# Patient Record
Sex: Male | Born: 1970 | Race: White | Hispanic: No | Marital: Married | Smoking: Never smoker
Health system: Southern US, Community
[De-identification: ages and names within clinical notes are randomized; demographics above are authoritative.]

## PROBLEM LIST (undated history)

## (undated) DIAGNOSIS — E785 Hyperlipidemia, unspecified: Secondary | ICD-10-CM

## (undated) DIAGNOSIS — R748 Abnormal levels of other serum enzymes: Secondary | ICD-10-CM

## (undated) DIAGNOSIS — I10 Essential (primary) hypertension: Secondary | ICD-10-CM

## (undated) HISTORY — PX: NO PAST SURGERIES: SHX2092

## (undated) HISTORY — DX: Abnormal levels of other serum enzymes: R74.8

---

## 2001-02-13 ENCOUNTER — Emergency Department (HOSPITAL_COMMUNITY): Admission: EM | Admit: 2001-02-13 | Discharge: 2001-02-13 | Payer: Self-pay | Admitting: *Deleted

## 2007-06-09 ENCOUNTER — Emergency Department (HOSPITAL_COMMUNITY): Admission: EM | Admit: 2007-06-09 | Discharge: 2007-06-09 | Payer: Self-pay | Admitting: Emergency Medicine

## 2011-01-12 ENCOUNTER — Encounter: Payer: Self-pay | Admitting: Family Medicine

## 2011-10-08 LAB — CBC
Platelets: 336
RDW: 12.5

## 2011-10-08 LAB — BASIC METABOLIC PANEL
BUN: 13
Calcium: 9.4
Creatinine, Ser: 0.99
GFR calc non Af Amer: >60
Glucose, Bld: 123 — ABNORMAL HIGH
Sodium: 138

## 2011-10-08 LAB — URINALYSIS, ROUTINE W REFLEX MICROSCOPIC
Ketones, ur: 15 — AB
Leukocytes, UA: NEGATIVE
Nitrite: NEGATIVE
Specific Gravity, Urine: >1.03 — ABNORMAL HIGH
Urobilinogen, UA: 0.2
pH: 6

## 2011-10-08 LAB — DIFFERENTIAL
Basophils Absolute: 0
Lymphocytes Relative: 6 — ABNORMAL LOW
Neutro Abs: 15.4 — ABNORMAL HIGH

## 2011-12-26 ENCOUNTER — Encounter: Payer: Self-pay | Admitting: Emergency Medicine

## 2011-12-26 ENCOUNTER — Emergency Department (HOSPITAL_COMMUNITY)
Admission: EM | Admit: 2011-12-26 | Discharge: 2011-12-26 | Disposition: A | Discharge status: Home / Self Care | Payer: BC Managed Care – PPO | Attending: Emergency Medicine | Admitting: Emergency Medicine

## 2011-12-26 DIAGNOSIS — T783XXA Angioneurotic edema, initial encounter: Secondary | ICD-10-CM | POA: Insufficient documentation

## 2011-12-26 DIAGNOSIS — Z79899 Other long term (current) drug therapy: Secondary | ICD-10-CM | POA: Insufficient documentation

## 2011-12-26 DIAGNOSIS — I10 Essential (primary) hypertension: Secondary | ICD-10-CM | POA: Insufficient documentation

## 2011-12-26 DIAGNOSIS — X58XXXA Exposure to other specified factors, initial encounter: Secondary | ICD-10-CM | POA: Insufficient documentation

## 2011-12-26 DIAGNOSIS — R6889 Other general symptoms and signs: Secondary | ICD-10-CM | POA: Insufficient documentation

## 2011-12-26 DIAGNOSIS — E785 Hyperlipidemia, unspecified: Secondary | ICD-10-CM | POA: Insufficient documentation

## 2011-12-26 DIAGNOSIS — R1310 Dysphagia, unspecified: Secondary | ICD-10-CM | POA: Insufficient documentation

## 2011-12-26 DIAGNOSIS — R220 Localized swelling, mass and lump, head: Secondary | ICD-10-CM | POA: Insufficient documentation

## 2011-12-26 HISTORY — DX: Essential (primary) hypertension: I10

## 2011-12-26 HISTORY — DX: Hyperlipidemia, unspecified: E78.5

## 2011-12-26 LAB — RAPID STREP SCREEN (MED CTR MEBANE ONLY): Streptococcus, Group A Screen (Direct): NEGATIVE

## 2011-12-26 MED ORDER — SODIUM CHLORIDE 0.9 % IV SOLN
INTRAVENOUS | Status: DC
  Administered 2011-12-26: 1000 mL via INTRAVENOUS

## 2011-12-26 MED ORDER — PREDNISONE 20 MG PO TABS: 40.0000 mg | ORAL_TABLET | Freq: Every day | ORAL | Status: DC

## 2011-12-26 MED ORDER — DIPHENHYDRAMINE HCL 50 MG/ML IJ SOLN
25.0000 mg | Freq: Once | INTRAMUSCULAR | Status: AC
  Administered 2011-12-26: 25 mg via INTRAVENOUS
  Filled 2011-12-26: qty 1

## 2011-12-26 MED ORDER — DIPHENHYDRAMINE HCL 25 MG PO TABS: 25.0000 mg | ORAL_TABLET | Freq: Four times a day (QID) | ORAL | Status: DC | PRN

## 2011-12-26 MED ORDER — METHYLPREDNISOLONE SODIUM SUCC 125 MG IJ SOLR
125.0000 mg | Freq: Once | INTRAMUSCULAR | Status: AC
Start: 2011-12-26 — End: 2011-12-26
  Administered 2011-12-26: 125 mg via INTRAVENOUS
  Filled 2011-12-26: qty 2

## 2011-12-26 NOTE — ED Provider Notes (Signed)
History     CSN: [id number]  Arrival date & time 12/26/11  0510   First MD Initiated Contact with Patient 12/26/11 [phone]      Chief Complaint  Patient presents with  . Sore Throat  . Oral Swelling    (Consider location/radiation/quality/duration/timing/severity/associated sxs/prior treatment) Patient is a 41 y.o. male presenting with pharyngitis. The history is provided by the patient.  Sore Throat This is a new problem. Pertinent negatives include no abdominal pain and no shortness of breath.   patient states that he woke up this morning with some pain in his throat and the feeling is choking. States it feels like there is something swollen back of his throat. No vomiting. He states his wife looked back and said that hangs of the back of his throat was swollen. No fevers. No nausea vomiting. Mix up some blood before he got here. No change in his medication. No previous symptoms like this.  Past Medical History  Diagnosis Date  . Hypertension   . Hyperlipemia     History reviewed. No pertinent past surgical history.  History reviewed. No pertinent family history.  History  Substance Use Topics  . Smoking status: Never Smoker   . Smokeless tobacco: Not on file  . Alcohol Use: Yes     Occasionally      Review of Systems  Constitutional: Negative for chills.  HENT: Positive for sore throat and trouble swallowing. Negative for voice change and ear discharge.   Respiratory: Positive for choking. Negative for shortness of breath and stridor.   Gastrointestinal: Negative for vomiting and abdominal pain.  Neurological: Negative for weakness and numbness.  Hematological: Negative for adenopathy.    Allergies  Review of patient's allergies indicates no known allergies.  Home Medications   Current Outpatient Rx  Name Route Sig Dispense Refill  . AMLODIPINE BESYLATE 10 MG PO TABS Oral Take 10 mg by mouth daily.      Marland Kitchen LISINOPRIL 10 MG PO TABS Oral Take 10 mg by mouth  daily.      Marland Kitchen SIMVASTATIN 20 MG PO TABS Oral Take 20 mg by mouth every evening.        BP 137/96  Pulse 84  Temp(Src) 97.9 F (36.6 C) (Oral)  Resp 18  Ht 5\' 10"  (1.778 m)  Wt 205 lb (92.987 kg)  BMI 29.41 kg/m2  SpO2 100%  Physical Exam  Constitutional: He appears well-developed and well-nourished.  HENT:  Head: Normocephalic.       Posterior pharyngeal edema uvula and over tonsils and posterior pharynx. No stridor. No exudate.  Eyes: Pupils are equal, round, and reactive to light.  Neck: Normal range of motion. No thyromegaly present.  Cardiovascular: Normal rate and regular rhythm.   Pulmonary/Chest: Effort normal.  Neurological: He is alert.  Skin: Skin is warm and dry. No erythema.    ED Course  Procedures (including critical care time)   Labs Reviewed  RAPID STREP SCREEN   No results found.   No diagnosis found.    MDM  Posterior pharyngeal angioedema. Woke with this morning. No impending airway obstruction this time. He'll be monitored for at least another hour. Steroids and Benadryl given here. His lisinopril will be stopped. He'll follow with his primary care Dr. Care is turned over to Dr. Rae Mar R. Rubin Payor, MD 12/26/11 [phone]

## 2011-12-26 NOTE — ED Notes (Signed)
Patient stated he woke up about 1 hour ago feeling like he is choking. States "it feels like something is swollen in the back of my throat and it's hard to swallow." Patient gagging in triage. Patient states he also spit up some blood before arriving to ED.

## 2012-01-23 ENCOUNTER — Encounter (HOSPITAL_COMMUNITY): Payer: Self-pay | Admitting: Emergency Medicine

## 2012-01-23 ENCOUNTER — Emergency Department (HOSPITAL_COMMUNITY)
Admission: EM | Admit: 2012-01-23 | Discharge: 2012-01-23 | Disposition: A | Discharge status: Home / Self Care | Payer: BC Managed Care – PPO | Attending: Emergency Medicine | Admitting: Emergency Medicine

## 2012-01-23 DIAGNOSIS — K117 Disturbances of salivary secretion: Secondary | ICD-10-CM | POA: Insufficient documentation

## 2012-01-23 DIAGNOSIS — I10 Essential (primary) hypertension: Secondary | ICD-10-CM | POA: Insufficient documentation

## 2012-01-23 DIAGNOSIS — R220 Localized swelling, mass and lump, head: Secondary | ICD-10-CM | POA: Insufficient documentation

## 2012-01-23 DIAGNOSIS — E785 Hyperlipidemia, unspecified: Secondary | ICD-10-CM | POA: Insufficient documentation

## 2012-01-23 DIAGNOSIS — R682 Dry mouth, unspecified: Secondary | ICD-10-CM

## 2012-01-23 DIAGNOSIS — R221 Localized swelling, mass and lump, neck: Secondary | ICD-10-CM | POA: Insufficient documentation

## 2012-01-23 MED ORDER — PREDNISONE 20 MG PO TABS
60.0000 mg | ORAL_TABLET | Freq: Once | ORAL | Status: AC
  Administered 2012-01-23: 60 mg via ORAL
  Filled 2012-01-23: qty 3

## 2012-01-23 MED ORDER — DIPHENHYDRAMINE HCL 25 MG PO CAPS
25.0000 mg | ORAL_CAPSULE | Freq: Once | ORAL | Status: AC
  Administered 2012-01-23: 25 mg via ORAL
  Filled 2012-01-23: qty 1

## 2012-01-23 NOTE — ED Provider Notes (Signed)
History    This chart was scribed for Kevin Roots, MD, MD by Smitty Pluck. The patient was seen in room APA07 and the patient's care was started at 10:13AM.   CSN: [id number]  Arrival date & time 01/23/12  4098   First MD Initiated Contact with Patient 01/23/12 1011      Chief Complaint  Patient presents with  . Oral Swelling    (Consider location/radiation/quality/duration/timing/severity/associated sxs/prior treatment) The history is provided by the patient.   Kevin Russo is a 41 y.o. male who presents to the Emergency Department complaining of throat swelling onset today. Constant. Not painful. No exacerbating or alleviating factors. Has taken no meds, incl benadryl, since onset symptoms.  Pt reports this has happened earlier this month and he was treated with steroids in ED, hx w angioedema. Was on lisinopril then. This feels much milder.. Pt states that the throat and uvula does not feel sore but more sore swollen. Pt denies fever, chills, nausea, rash, hives and vomiting. Pt reports he is able to drink without complication. He states he does not take lisinopril due to previous allergic reaction. Pt currently takes norvasc for HTN. No new meds or change in diet/foods. No family hx lip, tongue, throat swelling, or angioedema.   Past Medical History  Diagnosis Date  . Hypertension   . Hyperlipemia     History reviewed. No pertinent past surgical history.  History reviewed. No pertinent family history.  History  Substance Use Topics  . Smoking status: Never Smoker   . Smokeless tobacco: Not on file  . Alcohol Use: Yes     Occasionally      Review of Systems  All other systems reviewed and are negative.  no trouble breathing or swallowing. No throat pain. No rash/itching.  10 Systems reviewed and are negative for acute change except as noted in the HPI.  Allergies  Lisinopril  Home Medications   Current Outpatient Rx  Name Route Sig Dispense Refill  .  AMLODIPINE BESYLATE 10 MG PO TABS Oral Take 10 mg by mouth at bedtime.     Marland Kitchen DIPHENHYDRAMINE HCL 25 MG PO TABS Oral Take 1 tablet (25 mg total) by mouth every 6 (six) hours as needed for allergies. 20 tablet 0  . PREDNISONE 20 MG PO TABS Oral Take 2 tablets (40 mg total) by mouth daily. 6 tablet 0  . SIMVASTATIN 20 MG PO TABS Oral Take 20 mg by mouth every morning.       BP 145/83  Pulse 88  Temp(Src) 97.6 F (36.4 C) (Oral)  Resp 22  Ht 5\' 10"  (1.778 m)  Wt 205 lb (92.987 kg)  BMI 29.41 kg/m2  SpO2 99%  Physical Exam  Nursing note and vitals reviewed. Constitutional: He is oriented to person, place, and time. He appears well-developed and well-nourished. No distress.  HENT:  Head: Normocephalic and atraumatic.  Mouth/Throat: Oropharynx is clear and moist.       ? minimum edema of uvula  No asymmetric pharyngeal swelling. No exudate. No tongue or lip swelling.   Eyes: EOM are normal. Pupils are equal, round, and reactive to light.  Neck: Normal range of motion. Neck supple. No tracheal deviation present.  Cardiovascular: Normal rate, regular rhythm and normal heart sounds.   Pulmonary/Chest: Effort normal and breath sounds normal. No respiratory distress.       No stridor  Abdominal: Soft. He exhibits no distension.  Musculoskeletal: Normal range of motion.  Neurological: He is alert  and oriented to person, place, and time.  Skin: Skin is warm and dry. No rash noted.  Psychiatric: He has a normal mood and affect. His behavior is normal.    ED Course  Procedures (including critical care time) DIAGNOSTIC STUDIES: Oxygen Saturation is 99% on room air, normal by my interpretation.    COORDINATION OF CARE: 10:20AM EDP ordered medication: deltasone and benadryl    MDM  ?minimal edema to uvula. Benadryl and pred po.  No new med use, no change in diet/foods, no recent lisinopril use.   Recheck no edema noted. Pt also notes in past couple months recurrent problems with mouth  being/feeling dry. States no hx same. Other than norvasc denies other meds, no new meds.  Discussed ent referral/follow up.   I personally performed the services described in this documentation, which was scribed in my presence. The recorded information has been reviewed and considered. Kevin Roots, MD       Kevin Roots, MD 01/23/12 [phone]

## 2012-01-23 NOTE — ED Notes (Signed)
Pt states he woke up choking this am due to throat swelling. Pt states he was seen for the same this month and diagnosed with an allergic reaction. Pt states he stopped taking his lisinopril.

## 2013-09-12 ENCOUNTER — Encounter: Payer: Self-pay | Admitting: Family Medicine

## 2013-09-12 ENCOUNTER — Telehealth: Payer: Self-pay | Admitting: Family Medicine

## 2013-09-12 ENCOUNTER — Ambulatory Visit (INDEPENDENT_AMBULATORY_CARE_PROVIDER_SITE_OTHER): Payer: Managed Care, Other (non HMO) | Admitting: Family Medicine

## 2013-09-12 VITALS — BP 119/79 | HR 86 | Temp 98.2°F | Ht 70.0 in | Wt 212.0 lb

## 2013-09-12 DIAGNOSIS — J029 Acute pharyngitis, unspecified: Secondary | ICD-10-CM

## 2013-09-12 LAB — POCT RAPID STREP A (OFFICE): Rapid Strep A Screen: NEGATIVE

## 2013-09-12 MED ORDER — AZITHROMYCIN 250 MG PO TABS: ORAL_TABLET | ORAL | Status: DC

## 2013-09-12 NOTE — Progress Notes (Signed)
  Subjective:    Patient ID: Kevin Russo, male    DOB: [birth year], 42 y.o.   MRN: [id number]  HPI  This 42 y.o. male presents for evaluation of sinus congestion, sore throat, hurts when he eats Or swallows, washed out and tired, and is having wheeze and cough.  He has no productive Cough.  Review of Systems No chest pain, SOB, HA, dizziness, vision change, N/V, diarrhea, constipation, dysuria, urinary urgency or frequency, myalgias, arthralgias or rash.     Objective:   Physical Exam  Vital signs noted  Well developed well nourished male.  HEENT - Head atraumatic Normocephalic                Eyes - PERRLA, Conjuctiva - clear Sclera- Clear EOMI                Ears - EAC's Wnl TM's Wnl Gross Hearing WNL                Nose - Nares patent                 Throat - oropharanx injected no exudates. Respiratory - Lungs CTA bilateral Cardiac - RRR S1 and S2 without murmur.      Assessment & Plan:  Sore throat - Plan: POCT rapid strep A, azithromycin (ZITHROMAX) 250 MG tablet 2po first day and then one po qd x 4days.  WSWG's, tylenol and motrin otc as directed for pain and fever, push po fluids otc decongestants and mucolytics.

## 2013-09-12 NOTE — Telephone Encounter (Signed)
Appt made for today

## 2013-09-12 NOTE — Patient Instructions (Addendum)
Viral Pharyngitis Viral pharyngitis is a viral infection that produces redness, pain, and swelling (inflammation) of the throat. It can spread from person to person (contagious). CAUSES Viral pharyngitis is caused by inhaling a large amount of certain germs called viruses. Many different viruses cause viral pharyngitis. SYMPTOMS Symptoms of viral pharyngitis include:  Sore throat.  Tiredness.  Stuffy nose.  Low-grade fever.  Congestion.  Cough. TREATMENT Treatment includes rest, drinking plenty of fluids, and the use of over-the-counter medication (approved by your caregiver). HOME CARE INSTRUCTIONS   Drink enough fluids to keep your urine clear or pale yellow.  Eat soft, cold foods such as ice cream, frozen ice pops, or gelatin dessert.  Gargle with warm salt water (1 tsp salt per 1 qt of water).  If over age 7, throat lozenges may be used safely.  Only take over-the-counter or prescription medicines for pain, discomfort, or fever as directed by your caregiver. Do not take aspirin. To help prevent spreading viral pharyngitis to others, avoid:  Mouth-to-mouth contact with others.  Sharing utensils for eating and drinking.  Coughing around others. SEEK MEDICAL CARE IF:   You are better in a few days, then become worse.  You have a fever or pain not helped by pain medicines.  There are any other changes that concern you. Document Released: 09/17/2005 Document Revised: 03/01/2012 Document Reviewed: 02/13/2011 ExitCare Patient Information 2014 [employer].  

## 2013-10-19 ENCOUNTER — Other Ambulatory Visit: Payer: Self-pay | Admitting: *Deleted

## 2013-10-19 MED ORDER — AMLODIPINE BESYLATE 10 MG PO TABS: 10.0000 mg | ORAL_TABLET | Freq: Every day | ORAL | Status: DC

## 2013-12-28 ENCOUNTER — Encounter: Payer: Self-pay | Admitting: Nurse Practitioner

## 2013-12-28 ENCOUNTER — Ambulatory Visit (INDEPENDENT_AMBULATORY_CARE_PROVIDER_SITE_OTHER): Payer: Managed Care, Other (non HMO)

## 2013-12-28 ENCOUNTER — Ambulatory Visit (INDEPENDENT_AMBULATORY_CARE_PROVIDER_SITE_OTHER): Payer: Managed Care, Other (non HMO) | Admitting: Nurse Practitioner

## 2013-12-28 VITALS — BP 138/98 | HR 76 | Temp 99.6°F | Ht 70.0 in | Wt 218.0 lb

## 2013-12-28 DIAGNOSIS — R05 Cough: Secondary | ICD-10-CM

## 2013-12-28 DIAGNOSIS — I10 Essential (primary) hypertension: Secondary | ICD-10-CM

## 2013-12-28 DIAGNOSIS — J069 Acute upper respiratory infection, unspecified: Secondary | ICD-10-CM

## 2013-12-28 DIAGNOSIS — R059 Cough, unspecified: Secondary | ICD-10-CM

## 2013-12-28 MED ORDER — AZITHROMYCIN 250 MG PO TABS: ORAL_TABLET | ORAL | Status: DC

## 2013-12-28 MED ORDER — HYDROCODONE-HOMATROPINE 5-1.5 MG/5ML PO SYRP: 5.0000 mL | ORAL_SOLUTION | Freq: Three times a day (TID) | ORAL | Status: DC | PRN

## 2013-12-28 NOTE — Progress Notes (Signed)
   Subjective:    Patient ID: Kevin Russo, male    DOB: 11/19/1971, 43 y.o.   MRN: [id number]  HPI Patient in c/o cough and congestion- has had intermittent cough since first of December-    Review of Systems  Constitutional: Positive for fever (low grade). Negative for chills and appetite change.  HENT: Positive for congestion, rhinorrhea, sinus pressure, sneezing and sore throat. Negative for ear pain and trouble swallowing.   Respiratory: Positive for cough.   Cardiovascular: Negative.   Gastrointestinal: Negative.   All other systems reviewed and are negative.       Objective:   Physical Exam  Constitutional: He is oriented to person, place, and time. He appears well-developed and well-nourished.  HENT:  Right Ear: Hearing, tympanic membrane, external ear and ear canal normal.  Left Ear: Hearing, tympanic membrane, external ear and ear canal normal.  Nose: Mucosal edema and rhinorrhea present. Right sinus exhibits no maxillary sinus tenderness and no frontal sinus tenderness. Left sinus exhibits no maxillary sinus tenderness and no frontal sinus tenderness.  Mouth/Throat: Uvula is midline, oropharynx is clear and moist and mucous membranes are normal.  Neck: Normal range of motion. Neck supple.  Cardiovascular: Normal rate, regular rhythm and normal heart sounds.   Pulmonary/Chest: Effort normal and breath sounds normal.  Wet cough  Abdominal: Soft. Bowel sounds are normal.  Lymphadenopathy:    He has no cervical adenopathy.  Neurological: He is alert and oriented to person, place, and time.  Skin: Skin is warm and dry.  Psychiatric: He has a normal mood and affect. His behavior is normal. Judgment and thought content normal.    BP 138/98  Pulse 76  Temp(Src) 99.6 F (37.6 C) (Oral)  Ht 5\' 10"  (1.778 m)  Wt 218 lb (98.884 kg)  BMI 31.28 kg/m2       Assessment & Plan:   1. Cough   2. Hypertension   3. Upper respiratory infection    Meds ordered this  encounter  Medications  . azithromycin (ZITHROMAX Z-PAK) 250 MG tablet    Sig: As directed    Dispense:  6 each    Refill:  0    Order Specific Question:  Supervising Provider    Answer:  Ernestina PennaMOORE, DONALD W [[id number]]  . HYDROcodone-homatropine (HYCODAN) 5-1.5 MG/5ML syrup    Sig: Take 5 mLs by mouth every 8 (eight) hours as needed for cough.    Dispense:  120 mL    Refill:  0    Order Specific Question:  Supervising Provider    Answer:  Ernestina PennaMOORE, DONALD W [[id number]]   1. Take meds as prescribed 2. Use a cool mist humidifier especially during the winter months and when heat has  been humid. 3. Use saline nose sprays frequently 4. Saline irrigations of the nose can be very helpful if done frequently.  * 4X daily for 1 week*  * Use of a nettie pot can be helpful with this. Follow directions with this* 5. Drink plenty of fluids 6. Keep thermostat turn down low 7.For any cough or congestion  Use plain Mucinex- regular strength or max strength is fine   * Children- consult with Pharmacist for dosing 8. For fever or aces or pains- take tylenol or ibuprofen appropriate for age and weight.  * for fevers greater than 101 orally you may alternate ibuprofen and tylenol every  3 hours.   Mary-Margaret Daphine DeutscherMartin, FNP

## 2013-12-28 NOTE — Patient Instructions (Signed)

## 2014-06-09 ENCOUNTER — Other Ambulatory Visit: Payer: Self-pay | Admitting: Family Medicine

## 2014-07-09 ENCOUNTER — Other Ambulatory Visit: Payer: Self-pay | Admitting: Family Medicine

## 2014-08-21 ENCOUNTER — Other Ambulatory Visit: Payer: Self-pay | Admitting: Family Medicine

## 2014-09-22 ENCOUNTER — Other Ambulatory Visit: Payer: Self-pay | Admitting: Family Medicine

## 2014-09-25 NOTE — Telephone Encounter (Signed)
Last office visit was 12-28-13. Please advise on refill

## 2014-09-25 NOTE — Telephone Encounter (Signed)
no more refills without being seen  

## 2014-10-31 ENCOUNTER — Other Ambulatory Visit: Payer: Self-pay | Admitting: Nurse Practitioner

## 2014-10-31 NOTE — Telephone Encounter (Signed)
no more refills without being seen  

## 2014-10-31 NOTE — Telephone Encounter (Signed)
Last ov 1/15. ntbs 

## 2014-11-30 ENCOUNTER — Other Ambulatory Visit: Payer: Self-pay | Admitting: Nurse Practitioner

## 2014-12-11 ENCOUNTER — Other Ambulatory Visit: Payer: Self-pay | Admitting: Nurse Practitioner

## 2015-01-21 ENCOUNTER — Other Ambulatory Visit: Payer: Self-pay | Admitting: Nurse Practitioner

## 2015-03-22 ENCOUNTER — Ambulatory Visit (INDEPENDENT_AMBULATORY_CARE_PROVIDER_SITE_OTHER): Payer: Managed Care, Other (non HMO) | Admitting: Nurse Practitioner

## 2015-03-22 ENCOUNTER — Encounter: Payer: Self-pay | Admitting: Nurse Practitioner

## 2015-03-22 VITALS — BP 144/95 | HR 88 | Temp 98.7°F | Wt 226.0 lb

## 2015-03-22 DIAGNOSIS — K2100 Gastro-esophageal reflux disease with esophagitis, without bleeding: Secondary | ICD-10-CM | POA: Insufficient documentation

## 2015-03-22 MED ORDER — PANTOPRAZOLE SODIUM 40 MG PO TBEC: 40.0000 mg | DELAYED_RELEASE_TABLET | Freq: Every day | ORAL | Status: DC

## 2015-03-22 NOTE — Progress Notes (Signed)
  Subjective:     Kevin Russo is a 44 y.o. male who presents for evaluation of sinus pain. Symptoms include: congestion, cough, headaches and nasal congestion. Onset of symptoms was 2 months ago. Symptoms have been stable since that time. Cough gets worse after eating or when laying down. Has occasionaly heart burn. Eats a lot of [place] on his food Past history is significant for no history of pneumonia or bronchitis. Patient is a non-smoker. Has been to the ER 2x and has not gotten any better. Was given prednisone and cough syrup and has had a z pak.  The following portions of the patient's history were reviewed and updated as appropriate: allergies, current medications, past family history, past medical history, past social history, past surgical history and problem list.  Review of Systems Pertinent items are noted in HPI.   Objective:    General appearance: alert and cooperative Eyes: conjunctivae/corneas clear. PERRL, EOM's intact. Fundi benign. Ears: normal TM's and external ear canals both ears Nose: Nares normal. Septum midline. Mucosa normal. No drainage or sinus tenderness. Throat: lips, mucosa, and tongue normal; teeth and gums normal Neck: no adenopathy, no carotid bruit, no JVD, supple, symmetrical, trachea midline and thyroid not enlarged, symmetric, no tenderness/mass/nodules Lungs: clear to auscultation bilaterally Heart: regular rate and rhythm, S1, S2 normal, no murmur, click, rub or gallop    Assessment:    GERD.    Plan:   Meds ordered this encounter  Medications  . pantoprazole (PROTONIX) 40 MG tablet    Sig: Take 1 tablet (40 mg total) by mouth daily.    Dispense:  30 tablet    Refill:  3    Order Specific Question:  Supervising Provider    Answer:  Ernestina PennaMOORE, DONALD W [[id number]]   Avoid spicy and fatty foods- cut back on use of [place]. Do not eat 2 hours prior to bedtime RTO prn  Mary-Margaret Daphine DeutscherMartin, FNP

## 2015-03-22 NOTE — Patient Instructions (Signed)
Food Choices for Gastroesophageal Reflux Disease When you have gastroesophageal reflux disease (GERD), the foods you eat and your eating habits are very important. Choosing the right foods can help ease the discomfort of GERD. WHAT GENERAL GUIDELINES DO I NEED TO FOLLOW?  Choose fruits, vegetables, whole grains, low-fat dairy products, and low-fat meat, fish, and poultry.  Limit fats such as oils, salad dressings, butter, nuts, and avocado.  Keep a food diary to identify foods that cause symptoms.  Avoid foods that cause reflux. These may be different for different people.  Eat frequent small meals instead of three large meals each day.  Eat your meals slowly, in a relaxed setting.  Limit fried foods.  Cook foods using methods other than frying.  Avoid drinking alcohol.  Avoid drinking large amounts of liquids with your meals.  Avoid bending over or lying down until 2-3 hours after eating. WHAT FOODS ARE NOT RECOMMENDED? The following are some foods and drinks that may worsen your symptoms: Vegetables Tomatoes. Tomato juice. Tomato and spaghetti sauce. Chili peppers. Onion and garlic. Horseradish. Fruits Oranges, grapefruit, and lemon (fruit and juice). Meats High-fat meats, fish, and poultry. This includes hot dogs, ribs, ham, sausage, salami, and bacon. Dairy Whole milk and chocolate milk. Sour cream. Cream. Butter. Ice cream. Cream cheese.  Beverages Coffee and tea, with or without caffeine. Carbonated beverages or energy drinks. Condiments Hot sauce. Barbecue sauce.  Sweets/Desserts Chocolate and cocoa. Donuts. Peppermint and spearmint. Fats and Oils High-fat foods, including French fries and potato chips. Other Vinegar. Strong spices, such as black pepper, white pepper, red pepper, cayenne, curry powder, cloves, ginger, and chili powder. The items listed above may not be a complete list of foods and beverages to avoid. Contact your dietitian for more  information. Document Released: 12/08/2005 Document Revised: 12/13/2013 Document Reviewed: 10/12/2013 ExitCare Patient Information 2015 [employer]. This information is not intended to replace advice given to you by your health care provider. Make sure you discuss any questions you have with your health care provider. Gastroesophageal Reflux Disease, Adult Gastroesophageal reflux disease (GERD) happens when acid from your stomach flows up into the esophagus. When acid comes in contact with the esophagus, the acid causes soreness (inflammation) in the esophagus. Over time, GERD may create small holes (ulcers) in the lining of the esophagus. CAUSES   Increased body weight. This puts pressure on the stomach, making acid rise from the stomach into the esophagus.  Smoking. This increases acid production in the stomach.  Drinking alcohol. This causes decreased pressure in the lower esophageal sphincter (valve or ring of muscle between the esophagus and stomach), allowing acid from the stomach into the esophagus.  Late evening meals and a full stomach. This increases pressure and acid production in the stomach.  A malformed lower esophageal sphincter. Sometimes, no cause is found. SYMPTOMS   Burning pain in the lower part of the mid-chest behind the breastbone and in the mid-stomach area. This may occur twice a week or more often.  Trouble swallowing.  Sore throat.  Dry cough.  Asthma-like symptoms including chest tightness, shortness of breath, or wheezing. DIAGNOSIS  Your caregiver may be able to diagnose GERD based on your symptoms. In some cases, X-rays and other tests may be done to check for complications or to check the condition of your stomach and esophagus. TREATMENT  Your caregiver may recommend over-the-counter or prescription medicines to help decrease acid production. Ask your caregiver before starting or adding any new medicines.  HOME CARE   INSTRUCTIONS   Change the  factors that you can control. Ask your caregiver for guidance concerning weight loss, quitting smoking, and alcohol consumption.  Avoid foods and drinks that make your symptoms worse, such as:  Caffeine or alcoholic drinks.  Chocolate.  Peppermint or mint flavorings.  Garlic and onions.  Spicy foods.  Citrus fruits, such as oranges, lemons, or limes.  Tomato-based foods such as sauce, chili, salsa, and pizza.  Fried and fatty foods.  Avoid lying down for the 3 hours prior to your bedtime or prior to taking a nap.  Eat small, frequent meals instead of large meals.  Wear loose-fitting clothing. Do not wear anything tight around your waist that causes pressure on your stomach.  Raise the head of your bed 6 to 8 inches with wood blocks to help you sleep. Extra pillows will not help.  Only take over-the-counter or prescription medicines for pain, discomfort, or fever as directed by your caregiver.  Do not take aspirin, ibuprofen, or other nonsteroidal anti-inflammatory drugs (NSAIDs). SEEK IMMEDIATE MEDICAL CARE IF:   You have pain in your arms, neck, jaw, teeth, or back.  Your pain increases or changes in intensity or duration.  You develop nausea, vomiting, or sweating (diaphoresis).  You develop shortness of breath, or you faint.  Your vomit is green, yellow, black, or looks like coffee grounds or blood.  Your stool is red, bloody, or black. These symptoms could be signs of other problems, such as heart disease, gastric bleeding, or esophageal bleeding. MAKE SURE YOU:   Understand these instructions.  Will watch your condition.  Will get help right away if you are not doing well or get worse. Document Released: 09/17/2005 Document Revised: 03/01/2012 Document Reviewed: 06/27/2011 ExitCare Patient Information 2015 [employer]. This information is not intended to replace advice given to you by your health care provider. Make sure you discuss any questions you have  with your health care provider.  

## 2015-08-02 ENCOUNTER — Other Ambulatory Visit: Payer: Self-pay | Admitting: Nurse Practitioner

## 2015-08-02 NOTE — Telephone Encounter (Signed)
No labs here ever,

## 2015-08-02 NOTE — Telephone Encounter (Signed)
Patient aware rx sent to pharmacy and needs an appointment

## 2015-08-02 NOTE — Telephone Encounter (Signed)
Only 30 day supply of norvac approved Patient NTBS for follow up and lab work

## 2015-09-02 ENCOUNTER — Other Ambulatory Visit: Payer: Self-pay | Admitting: Nurse Practitioner

## 2015-09-21 ENCOUNTER — Other Ambulatory Visit: Payer: Self-pay | Admitting: Nurse Practitioner

## 2015-10-11 ENCOUNTER — Encounter: Payer: Managed Care, Other (non HMO) | Admitting: Family Medicine

## 2015-10-12 ENCOUNTER — Encounter: Payer: Self-pay | Admitting: Nurse Practitioner

## 2015-10-26 ENCOUNTER — Ambulatory Visit (INDEPENDENT_AMBULATORY_CARE_PROVIDER_SITE_OTHER): Payer: Managed Care, Other (non HMO) | Admitting: Family

## 2015-10-26 ENCOUNTER — Encounter: Payer: Self-pay | Admitting: Family

## 2015-10-26 VITALS — BP 161/103 | HR 115 | Temp 98.3°F | Ht 70.5 in | Wt 228.6 lb

## 2015-10-26 DIAGNOSIS — Z0000 Encounter for general adult medical examination without abnormal findings: Secondary | ICD-10-CM | POA: Diagnosis not present

## 2015-10-26 DIAGNOSIS — I10 Essential (primary) hypertension: Secondary | ICD-10-CM

## 2015-10-26 DIAGNOSIS — K2100 Gastro-esophageal reflux disease with esophagitis, without bleeding: Secondary | ICD-10-CM

## 2015-10-26 DIAGNOSIS — Z23 Encounter for immunization: Secondary | ICD-10-CM

## 2015-10-26 MED ORDER — LISINOPRIL-HYDROCHLOROTHIAZIDE 20-12.5 MG PO TABS: 1.0000 | ORAL_TABLET | Freq: Every day | ORAL | Status: DC

## 2015-10-26 MED ORDER — AMLODIPINE BESYLATE 10 MG PO TABS: 10.0000 mg | ORAL_TABLET | Freq: Every day | ORAL | Status: DC

## 2015-10-26 NOTE — Patient Instructions (Signed)

## 2015-10-26 NOTE — Addendum Note (Signed)
Addended by: Almeta MonasSTONE, Chaselynn Kepple M on: 10/26/2015 04:23 PM   Modules accepted: Orders

## 2015-10-26 NOTE — Progress Notes (Signed)
Subjective:    Patient ID: Kevin Russo, male    DOB: [birth year], 44 y.o.   MRN: [id number]  PT presents to the office today for CPE. Pt's BP is elevated today. Pt states he is very "stressed" today.  Hypertension This is a chronic problem. The current episode started more than 1 year ago. The problem has been waxing and waning since onset. The problem is uncontrolled. Pertinent negatives include no headaches, palpitations, peripheral edema or shortness of breath. Risk factors for coronary artery disease include family history, male gender, obesity and sedentary lifestyle. Past treatments include calcium channel blockers. The current treatment provides mild improvement. There is no history of kidney disease, CAD/MI, CVA, heart failure or a thyroid problem. There is no history of sleep apnea.  Gastroesophageal Reflux He reports no belching, no choking or no heartburn. This is a chronic problem. The current episode started more than 1 year ago. The problem occurs rarely. The problem has been waxing and waning. Risk factors include obesity. He has tried a PPI for the symptoms. The treatment provided moderate relief.      Review of Systems  Constitutional: Negative.   HENT: Negative.   Respiratory: Negative.  Negative for choking and shortness of breath.   Cardiovascular: Negative.  Negative for palpitations.  Gastrointestinal: Negative.  Negative for heartburn.  Endocrine: Negative.   Genitourinary: Negative.   Musculoskeletal: Negative.   Neurological: Negative.  Negative for headaches.  Hematological: Negative.   Psychiatric/Behavioral: Negative.   All other systems reviewed and are negative.      Objective:   Physical Exam  Constitutional: He is oriented to person, place, and time. He appears well-developed and well-nourished. No distress.  HENT:  Head: Normocephalic.  Right Ear: External ear normal.  Left Ear: External ear normal.  Nose: Nose normal.  Mouth/Throat: Oropharynx  is clear and moist.  Eyes: Pupils are equal, round, and reactive to light. Right eye exhibits no discharge. Left eye exhibits no discharge.  Neck: Normal range of motion. Neck supple. No thyromegaly present.  Cardiovascular: Normal rate, regular rhythm, normal heart sounds and intact distal pulses.   No murmur heard. Pulmonary/Chest: Effort normal and breath sounds normal. No respiratory distress. He has no wheezes.  Abdominal: Soft. Bowel sounds are normal. He exhibits no distension. There is no tenderness.  Musculoskeletal: Normal range of motion. He exhibits no edema or tenderness.  Neurological: He is alert and oriented to person, place, and time. He has normal reflexes. No cranial nerve deficit.  Skin: Skin is warm and dry. No rash noted. No erythema.  Psychiatric: He has a normal mood and affect. His behavior is normal. Judgment and thought content normal.  Vitals reviewed.   BP 161/103 mmHg  Pulse 115  Temp(Src) 98.3 F (36.8 C) (Oral)  Ht 5' 10.5" (1.791 m)  Wt 228 lb 9.6 oz (103.692 kg)  BMI 32.33 kg/m2       Assessment & Plan:  1. Essential hypertension -Pt started on Zestoretic 20-12.5 mg today --Daily blood pressure log given with instructions on how to fill out and told to bring to next visit -Dash diet information given -Exercise encouraged - Stress Management  -Continue current meds -RTO in 2 weeks - CMP14+EGFR - lisinopril-hydrochlorothiazide (ZESTORETIC) 20-12.5 MG tablet; Take 1 tablet by mouth daily.  Dispense: 90 tablet; Refill: 0 - amLODipine (NORVASC) 10 MG tablet; Take 1 tablet (10 mg total) by mouth daily.  Dispense: 90 tablet; Refill: 2  2. Gastroesophageal reflux disease with esophagitis -  CMP14+EGFR  3. Annual physical exam - CMP14+EGFR - Lipid panel - CBC with Differential/Platelet - Thyroid Panel With TSH - Vit D  25 hydroxy (rtn osteoporosis monitoring) - PSA, total and free   Continue all meds Labs pending Health Maintenance reviewed-  TDAP given today Diet and exercise encouraged RTO 2 weeks to recheck HTN  Evelina Dun, FNP

## 2015-10-27 LAB — CBC WITH DIFFERENTIAL/PLATELET
BASOS ABS: 0.1 10*3/uL (ref 0.0–0.2)
Basos: 2 %
EOS (ABSOLUTE): 0.2 10*3/uL (ref 0.0–0.4)
EOS: 2 %
HEMATOCRIT: 48 % (ref 37.5–51.0)
HEMOGLOBIN: 16.9 g/dL (ref 12.6–17.7)
IMMATURE GRANS (ABS): 0 10*3/uL (ref 0.0–0.1)
IMMATURE GRANULOCYTES: 0 %
LYMPHS ABS: 2.2 10*3/uL (ref 0.7–3.1)
LYMPHS: 33 %
MCH: 29.8 pg (ref 26.6–33.0)
MCHC: 35.2 g/dL (ref 31.5–35.7)
MCV: 85 fL (ref 79–97)
MONOCYTES: 13 %
Monocytes Absolute: 0.9 10*3/uL (ref 0.1–0.9)
NEUTROS PCT: 50 %
Neutrophils Absolute: 3.3 10*3/uL (ref 1.4–7.0)
Platelets: 360 10*3/uL (ref 150–379)
RBC: 5.67 x10E6/uL (ref 4.14–5.80)
RDW: 13.7 % (ref 12.3–15.4)
WBC: 6.5 10*3/uL (ref 3.4–10.8)

## 2015-10-27 LAB — PSA, TOTAL AND FREE
PROSTATE SPECIFIC AG, SERUM: 0.6 ng/mL (ref 0.0–4.0)
PSA, Free Pct: 48.3 %
PSA, Free: 0.29 ng/mL

## 2015-10-27 LAB — LIPID PANEL
CHOL/HDL RATIO: 6.6 ratio — AB (ref 0.0–5.0)
Cholesterol, Total: 236 mg/dL — ABNORMAL HIGH (ref 100–199)
HDL: 36 mg/dL — AB (ref >39)
LDL Calculated: 126 mg/dL — ABNORMAL HIGH (ref 0–99)
Triglycerides: 368 mg/dL — ABNORMAL HIGH (ref 0–149)
VLDL Cholesterol Cal: 74 mg/dL — ABNORMAL HIGH (ref 5–40)

## 2015-10-27 LAB — CMP14+EGFR
A/G RATIO: 1.3 (ref 1.1–2.5)
ALT: 146 IU/L — AB (ref 0–44)
AST: 57 IU/L — ABNORMAL HIGH (ref 0–40)
Albumin: 4.4 g/dL (ref 3.5–5.5)
Alkaline Phosphatase: 132 IU/L — ABNORMAL HIGH (ref 39–117)
BILIRUBIN TOTAL: 0.4 mg/dL (ref 0.0–1.2)
BUN/Creatinine Ratio: 12 (ref 9–20)
BUN: 11 mg/dL (ref 6–24)
CHLORIDE: 101 mmol/L (ref 97–106)
CO2: 22 mmol/L (ref 18–29)
Calcium: 9.4 mg/dL (ref 8.7–10.2)
Creatinine, Ser: 0.91 mg/dL (ref 0.76–1.27)
GFR, EST AFRICAN AMERICAN: 120 mL/min/{1.73_m2} (ref >59)
GFR, EST NON AFRICAN AMERICAN: 104 mL/min/{1.73_m2} (ref >59)
GLOBULIN, TOTAL: 3.3 g/dL (ref 1.5–4.5)
Glucose: 114 mg/dL — ABNORMAL HIGH (ref 65–99)
POTASSIUM: 3.7 mmol/L (ref 3.5–5.2)
SODIUM: 141 mmol/L (ref 136–144)
TOTAL PROTEIN: 7.7 g/dL (ref 6.0–8.5)

## 2015-10-27 LAB — THYROID PANEL WITH TSH
Free Thyroxine Index: 1.9 (ref 1.2–4.9)
T3 UPTAKE RATIO: 23 % — AB (ref 24–39)
T4 TOTAL: 8.4 ug/dL (ref 4.5–12.0)
TSH: 1.19 u[IU]/mL (ref 0.450–4.500)

## 2015-10-27 LAB — VITAMIN D 25 HYDROXY (VIT D DEFICIENCY, FRACTURES): VIT D 25 HYDROXY: 12.1 ng/mL — AB (ref 30.0–100.0)

## 2015-10-29 ENCOUNTER — Other Ambulatory Visit: Payer: Self-pay | Admitting: Family

## 2015-10-29 DIAGNOSIS — R748 Abnormal levels of other serum enzymes: Secondary | ICD-10-CM

## 2015-10-29 DIAGNOSIS — E785 Hyperlipidemia, unspecified: Secondary | ICD-10-CM

## 2015-10-29 MED ORDER — ATORVASTATIN CALCIUM 20 MG PO TABS: 20.0000 mg | ORAL_TABLET | Freq: Every day | ORAL | Status: DC

## 2015-10-30 ENCOUNTER — Telehealth: Payer: Self-pay | Admitting: Family

## 2015-10-30 NOTE — Telephone Encounter (Signed)
Spoke with patient about labs and why referral was placed.

## 2015-11-09 ENCOUNTER — Ambulatory Visit: Payer: Managed Care, Other (non HMO) | Admitting: Family

## 2015-11-14 ENCOUNTER — Ambulatory Visit: Payer: Managed Care, Other (non HMO) | Admitting: Family Medicine

## 2015-11-19 ENCOUNTER — Encounter: Payer: Self-pay | Admitting: Family

## 2016-11-03 ENCOUNTER — Encounter: Payer: Self-pay | Admitting: Family

## 2016-11-03 ENCOUNTER — Ambulatory Visit (INDEPENDENT_AMBULATORY_CARE_PROVIDER_SITE_OTHER): Payer: Managed Care, Other (non HMO) | Admitting: Family

## 2016-11-03 VITALS — BP 147/97 | HR 86 | Temp 97.6°F | Ht 70.5 in | Wt 228.0 lb

## 2016-11-03 DIAGNOSIS — K2100 Gastro-esophageal reflux disease with esophagitis, without bleeding: Secondary | ICD-10-CM

## 2016-11-03 DIAGNOSIS — Z0000 Encounter for general adult medical examination without abnormal findings: Secondary | ICD-10-CM | POA: Diagnosis not present

## 2016-11-03 DIAGNOSIS — I10 Essential (primary) hypertension: Secondary | ICD-10-CM

## 2016-11-03 DIAGNOSIS — E785 Hyperlipidemia, unspecified: Secondary | ICD-10-CM

## 2016-11-03 DIAGNOSIS — E669 Obesity, unspecified: Secondary | ICD-10-CM

## 2016-11-03 DIAGNOSIS — R350 Frequency of micturition: Secondary | ICD-10-CM

## 2016-11-03 LAB — MICROSCOPIC EXAMINATION
BACTERIA UA: NONE SEEN
Epithelial Cells (non renal): NONE SEEN /hpf (ref 0–10)
RBC, UA: NONE SEEN /hpf (ref 0–?)
WBC UA: NONE SEEN /HPF (ref 0–?)

## 2016-11-03 LAB — URINALYSIS, COMPLETE
BILIRUBIN UA: NEGATIVE
GLUCOSE, UA: NEGATIVE
Ketones, UA: NEGATIVE
Leukocytes, UA: NEGATIVE
Nitrite, UA: NEGATIVE
PROTEIN UA: NEGATIVE
RBC, UA: NEGATIVE
SPEC GRAV UA: 1.03 (ref 1.005–1.030)
UUROB: 0.2 mg/dL (ref 0.2–1.0)
pH, UA: 5 (ref 5.0–7.5)

## 2016-11-03 MED ORDER — LISINOPRIL-HYDROCHLOROTHIAZIDE 20-12.5 MG PO TABS: 1.0000 | ORAL_TABLET | Freq: Every day | ORAL | 0 refills | Status: DC

## 2016-11-03 NOTE — Progress Notes (Signed)
Subjective:    Patient ID: Kevin Russo, male    DOB: 04-16-1971, 45 y.o.   MRN: [id number]  PT presents to the office today for CPE. Pt's BP is elevated today. Pt states he never started his lisinopril/HCTZ. He will start today. PT is complaining of frequent urination at night that started several months ago. PT states it does seem cloudy.  Hypertension  This is a chronic problem. The current episode started more than 1 year ago. The problem has been waxing and waning since onset. The problem is uncontrolled. Pertinent negatives include no headaches, palpitations, peripheral edema or shortness of breath. Risk factors for coronary artery disease include family history, male gender, obesity and sedentary lifestyle. Past treatments include calcium channel blockers, ACE inhibitors and diuretics. The current treatment provides mild improvement. There is no history of kidney disease, CAD/MI, CVA, heart failure or a thyroid problem. There is no history of sleep apnea.  Gastroesophageal Reflux  He reports no belching, no choking or no heartburn. This is a chronic problem. The current episode started more than 1 year ago. The problem occurs rarely. The problem has been waxing and waning. Risk factors include obesity. He has tried an antacid and a diet change for the symptoms. The treatment provided moderate relief.  Hyperlipidemia  This is a chronic problem. The current episode started more than 1 year ago. The problem is uncontrolled. Recent lipid tests were reviewed and are high. Exacerbating diseases include obesity. Pertinent negatives include no shortness of breath. Current antihyperlipidemic treatment includes diet change. The current treatment provides no improvement of lipids. Compliance problems include adherence to diet.  Risk factors for coronary artery disease include a sedentary lifestyle, male sex, hypertension, obesity and dyslipidemia.      Review of Systems  Constitutional: Negative.     HENT: Negative.   Respiratory: Negative.  Negative for choking and shortness of breath.   Cardiovascular: Negative.  Negative for palpitations.  Gastrointestinal: Negative.  Negative for heartburn.  Endocrine: Negative.   Genitourinary: Negative.   Musculoskeletal: Negative.   Neurological: Negative.  Negative for headaches.  Hematological: Negative.   Psychiatric/Behavioral: Negative.   All other systems reviewed and are negative.      Objective:   Physical Exam  Constitutional: He is oriented to person, place, and time. He appears well-developed and well-nourished. No distress.  HENT:  Head: Normocephalic.  Right Ear: External ear normal.  Left Ear: External ear normal.  Nose: Nose normal.  Mouth/Throat: Oropharynx is clear and moist.  Eyes: Pupils are equal, round, and reactive to light. Right eye exhibits no discharge. Left eye exhibits no discharge.  Neck: Normal range of motion. Neck supple. No thyromegaly present.  Cardiovascular: Normal rate, regular rhythm, normal heart sounds and intact distal pulses.   No murmur heard. Pulmonary/Chest: Effort normal and breath sounds normal. No respiratory distress. He has no wheezes.  Abdominal: Soft. Bowel sounds are normal. He exhibits no distension. There is no tenderness.  Musculoskeletal: Normal range of motion. He exhibits no edema or tenderness.  Neurological: He is alert and oriented to person, place, and time. He has normal reflexes. No cranial nerve deficit.  Skin: Skin is warm and dry. No rash noted. No erythema.  Psychiatric: He has a normal mood and affect. His behavior is normal. Judgment and thought content normal.  Vitals reviewed.   BP (!) 147/97   Pulse 86   Temp 97.6 F (36.4 C) (Oral)   Ht 5' 10.5" (1.791 m)  Wt 228 lb (103.4 kg)   BMI 32.25 kg/m        Assessment & Plan:  1. Essential hypertension - CMP14+EGFR - CBC with Differential/Platelet - lisinopril-hydrochlorothiazide (ZESTORETIC) 20-12.5  MG tablet; Take 1 tablet by mouth daily.  Dispense: 90 tablet; Refill: 0  2. Gastroesophageal reflux disease with esophagitis - CMP14+EGFR - CBC with Differential/Platelet  3. Hyperlipidemia, unspecified hyperlipidemia type - CMP14+EGFR - CBC with Differential/Platelet - Lipid panel  4. Obesity (BMI 30-39.9) - CMP14+EGFR - CBC with Differential/Platelet  5. Annual physical exam - CMP14+EGFR - CBC with Differential/Platelet - Lipid panel - PSA, total and free - Thyroid Panel With TSH - VITAMIN D 25 Hydroxy (Vit-D Deficiency, Fractures) - lisinopril-hydrochlorothiazide (ZESTORETIC) 20-12.5 MG tablet; Take 1 tablet by mouth daily.  Dispense: 90 tablet; Refill: 0  6. Urine frequency - Urinalysis, Complete   Continue all meds Labs pending Health Maintenance reviewed Diet and exercise encouraged RTO 2 weeks to recheck HTN and discuss BPH symptoms   Evelina Dun, FNP

## 2016-11-03 NOTE — Patient Instructions (Signed)
Benign Prostatic Hyperplasia An enlarged prostate (benign prostatic hyperplasia) is common in older men. You may experience the following:  Weak urine stream.  Dribbling.  Feeling like the bladder has not emptied completely.  Difficulty starting urination.  Getting up frequently at night to urinate.  Urinating more frequently during the day. HOME CARE INSTRUCTIONS  Monitor your prostatic hyperplasia for any changes. The following actions may help to alleviate any discomfort you are experiencing:  Give yourself time when you urinate.  Stay away from alcohol.  Avoid beverages containing caffeine, such as coffee, tea, and colas, because they can make the problem worse.  Avoid decongestants, antihistamines, and some prescription medicines that can make the problem worse.  Follow up with your health care provider for further treatment as recommended. SEEK MEDICAL CARE IF:  You are experiencing progressive difficulty voiding.  Your urine stream is progressively getting narrower.  You are awaking from sleep with the urge to void more frequently.  You are constantly feeling the need to void.  You experience loss of urine, especially in small amounts. SEEK IMMEDIATE MEDICAL CARE IF:   You develop increased pain with urination or are unable to urinate.  You develop severe abdominal pain, vomiting, a high fever, or fainting.  You develop back pain or blood in your urine. MAKE SURE YOU:   Understand these instructions.  Will watch your condition.  Will get help right away if you are not doing well or get worse.   This information is not intended to replace advice given to you by your health care provider. Make sure you discuss any questions you have with your health care provider.   Document Released: 12/08/2005 Document Revised: 12/29/2014 Document Reviewed: 05/10/2013 Elsevier Interactive Patient Education 2016 [employer]  

## 2016-11-04 ENCOUNTER — Other Ambulatory Visit: Payer: Self-pay | Admitting: Family

## 2016-11-04 DIAGNOSIS — R748 Abnormal levels of other serum enzymes: Secondary | ICD-10-CM

## 2016-11-04 LAB — CMP14+EGFR
ALK PHOS: 134 IU/L — AB (ref 39–117)
ALT: 114 IU/L — AB (ref 0–44)
AST: 47 IU/L — AB (ref 0–40)
Albumin/Globulin Ratio: 1.4 (ref 1.2–2.2)
Albumin: 4.6 g/dL (ref 3.5–5.5)
BUN/Creatinine Ratio: 13 (ref 9–20)
BUN: 13 mg/dL (ref 6–24)
Bilirubin Total: 0.7 mg/dL (ref 0.0–1.2)
CALCIUM: 9.6 mg/dL (ref 8.7–10.2)
CO2: 22 mmol/L (ref 18–29)
CREATININE: 0.99 mg/dL (ref 0.76–1.27)
Chloride: 100 mmol/L (ref 96–106)
GFR calc Af Amer: 107 mL/min/{1.73_m2} (ref >59)
GFR, EST NON AFRICAN AMERICAN: 93 mL/min/{1.73_m2} (ref >59)
GLUCOSE: 91 mg/dL (ref 65–99)
Globulin, Total: 3.3 g/dL (ref 1.5–4.5)
POTASSIUM: 3.8 mmol/L (ref 3.5–5.2)
Sodium: 141 mmol/L (ref 134–144)
Total Protein: 7.9 g/dL (ref 6.0–8.5)

## 2016-11-04 LAB — THYROID PANEL WITH TSH
FREE THYROXINE INDEX: 2 (ref 1.2–4.9)
T3 UPTAKE RATIO: 27 % (ref 24–39)
T4 TOTAL: 7.4 ug/dL (ref 4.5–12.0)
TSH: 1.33 u[IU]/mL (ref 0.450–4.500)

## 2016-11-04 LAB — LIPID PANEL
Chol/HDL Ratio: 5.4 ratio units — ABNORMAL HIGH (ref 0.0–5.0)
Cholesterol, Total: 238 mg/dL — ABNORMAL HIGH (ref 100–199)
HDL: 44 mg/dL (ref >39)
LDL Calculated: 162 mg/dL — ABNORMAL HIGH (ref 0–99)
TRIGLYCERIDES: 161 mg/dL — AB (ref 0–149)
VLDL CHOLESTEROL CAL: 32 mg/dL (ref 5–40)

## 2016-11-04 LAB — CBC WITH DIFFERENTIAL/PLATELET
BASOS: 2 %
Basophils Absolute: 0.1 10*3/uL (ref 0.0–0.2)
EOS (ABSOLUTE): 0.1 10*3/uL (ref 0.0–0.4)
EOS: 2 %
Hematocrit: 51.3 % — ABNORMAL HIGH (ref 37.5–51.0)
Hemoglobin: 17.8 g/dL — ABNORMAL HIGH (ref 12.6–17.7)
IMMATURE GRANS (ABS): 0 10*3/uL (ref 0.0–0.1)
IMMATURE GRANULOCYTES: 0 %
LYMPHS: 30 %
Lymphocytes Absolute: 2.1 10*3/uL (ref 0.7–3.1)
MCH: 29.9 pg (ref 26.6–33.0)
MCHC: 34.7 g/dL (ref 31.5–35.7)
MCV: 86 fL (ref 79–97)
Monocytes Absolute: 0.8 10*3/uL (ref 0.1–0.9)
Monocytes: 11 %
NEUTROS PCT: 55 %
Neutrophils Absolute: 3.8 10*3/uL (ref 1.4–7.0)
PLATELETS: 291 10*3/uL (ref 150–379)
RBC: 5.96 x10E6/uL — AB (ref 4.14–5.80)
RDW: 13.9 % (ref 12.3–15.4)
WBC: 6.9 10*3/uL (ref 3.4–10.8)

## 2016-11-04 LAB — VITAMIN D 25 HYDROXY (VIT D DEFICIENCY, FRACTURES): VIT D 25 HYDROXY: 15.1 ng/mL — AB (ref 30.0–100.0)

## 2016-11-04 LAB — PSA, TOTAL AND FREE
PROSTATE SPECIFIC AG, SERUM: 0.7 ng/mL (ref 0.0–4.0)
PSA, Free Pct: 40 %
PSA, Free: 0.28 ng/mL

## 2016-11-04 MED ORDER — TAMSULOSIN HCL 0.4 MG PO CAPS: 0.4000 mg | ORAL_CAPSULE | Freq: Every day | ORAL | 1 refills | Status: DC

## 2016-11-04 MED ORDER — VITAMIN D (ERGOCALCIFEROL) 1.25 MG (50000 UNIT) PO CAPS: 50000.0000 [IU] | ORAL_CAPSULE | ORAL | 3 refills | Status: DC

## 2016-11-04 MED ORDER — ATORVASTATIN CALCIUM 20 MG PO TABS: 20.0000 mg | ORAL_TABLET | Freq: Every day | ORAL | 1 refills | Status: DC

## 2016-11-07 ENCOUNTER — Ambulatory Visit (HOSPITAL_COMMUNITY): Payer: Managed Care, Other (non HMO)

## 2016-11-10 ENCOUNTER — Ambulatory Visit (HOSPITAL_COMMUNITY)
Admission: RE | Admit: 2016-11-10 | Discharge: 2016-11-10 | Disposition: A | Discharge status: Home / Self Care | Payer: Managed Care, Other (non HMO) | Source: Ambulatory Visit | Attending: Family | Admitting: Family

## 2016-11-10 DIAGNOSIS — K824 Cholesterolosis of gallbladder: Secondary | ICD-10-CM | POA: Diagnosis not present

## 2016-11-10 DIAGNOSIS — R748 Abnormal levels of other serum enzymes: Secondary | ICD-10-CM | POA: Insufficient documentation

## 2016-11-24 ENCOUNTER — Other Ambulatory Visit: Payer: Self-pay | Admitting: Family

## 2016-11-24 DIAGNOSIS — I10 Essential (primary) hypertension: Secondary | ICD-10-CM

## 2017-02-21 ENCOUNTER — Other Ambulatory Visit: Payer: Self-pay | Admitting: Family

## 2017-02-21 DIAGNOSIS — Z0000 Encounter for general adult medical examination without abnormal findings: Secondary | ICD-10-CM

## 2017-02-21 DIAGNOSIS — I10 Essential (primary) hypertension: Secondary | ICD-10-CM

## 2017-06-27 ENCOUNTER — Other Ambulatory Visit: Payer: Self-pay | Admitting: Family

## 2017-06-27 DIAGNOSIS — Z0000 Encounter for general adult medical examination without abnormal findings: Secondary | ICD-10-CM

## 2017-06-27 DIAGNOSIS — I10 Essential (primary) hypertension: Secondary | ICD-10-CM

## 2017-06-29 NOTE — Telephone Encounter (Signed)
Last seen 11/03/16  [name]   Requesting 90 day supply

## 2017-07-12 ENCOUNTER — Other Ambulatory Visit: Payer: Self-pay | Admitting: Family

## 2017-07-12 DIAGNOSIS — I10 Essential (primary) hypertension: Secondary | ICD-10-CM

## 2017-07-14 NOTE — Telephone Encounter (Signed)
Last seen 11/03/16  [facility]

## 2017-10-13 ENCOUNTER — Other Ambulatory Visit: Payer: Self-pay | Admitting: Family

## 2017-10-13 DIAGNOSIS — I10 Essential (primary) hypertension: Secondary | ICD-10-CM

## 2017-10-13 DIAGNOSIS — Z0000 Encounter for general adult medical examination without abnormal findings: Secondary | ICD-10-CM

## 2017-10-13 NOTE — Telephone Encounter (Signed)
Last seen 11/03/16  [facility]

## 2017-10-26 ENCOUNTER — Other Ambulatory Visit: Payer: Self-pay | Admitting: Family

## 2017-10-26 DIAGNOSIS — I10 Essential (primary) hypertension: Secondary | ICD-10-CM

## 2017-10-26 NOTE — Telephone Encounter (Signed)
Last seen 11/03/16  [facility]

## 2017-11-05 ENCOUNTER — Encounter: Payer: Managed Care, Other (non HMO) | Admitting: Family

## 2017-11-09 ENCOUNTER — Encounter: Payer: Self-pay | Admitting: Family

## 2017-11-09 ENCOUNTER — Ambulatory Visit (INDEPENDENT_AMBULATORY_CARE_PROVIDER_SITE_OTHER): Payer: Managed Care, Other (non HMO) | Admitting: Family

## 2017-11-09 VITALS — BP 122/72 | HR 76 | Temp 98.1°F | Ht 70.5 in | Wt 236.0 lb

## 2017-11-09 DIAGNOSIS — Z0000 Encounter for general adult medical examination without abnormal findings: Secondary | ICD-10-CM | POA: Diagnosis not present

## 2017-11-09 DIAGNOSIS — I10 Essential (primary) hypertension: Secondary | ICD-10-CM

## 2017-11-09 DIAGNOSIS — K2100 Gastro-esophageal reflux disease with esophagitis, without bleeding: Secondary | ICD-10-CM

## 2017-11-09 DIAGNOSIS — E785 Hyperlipidemia, unspecified: Secondary | ICD-10-CM

## 2017-11-09 DIAGNOSIS — E669 Obesity, unspecified: Secondary | ICD-10-CM

## 2017-11-09 MED ORDER — ATORVASTATIN CALCIUM 20 MG PO TABS
20.0000 mg | ORAL_TABLET | Freq: Every day | ORAL | 1 refills | Status: DC
Start: 2017-11-09 — End: 2017-11-10

## 2017-11-09 MED ORDER — AMLODIPINE BESYLATE 10 MG PO TABS: 10.0000 mg | ORAL_TABLET | Freq: Every day | ORAL | 1 refills | Status: DC

## 2017-11-09 MED ORDER — LISINOPRIL-HYDROCHLOROTHIAZIDE 20-12.5 MG PO TABS: 1.0000 | ORAL_TABLET | Freq: Every day | ORAL | 1 refills | Status: DC

## 2017-11-09 NOTE — Progress Notes (Signed)
Subjective:    Patient ID: Kevin Russo, male    DOB: [birth year], 46 y.o.   MRN: [id number]  PT presents to the office today for CPE.  Hypertension  This is a chronic problem. The current episode started more than 1 year ago. The problem has been resolved since onset. The problem is controlled. Pertinent negatives include no headaches, malaise/fatigue, peripheral edema or shortness of breath. Risk factors for coronary artery disease include obesity, male gender, sedentary lifestyle, dyslipidemia and family history. The current treatment provides moderate improvement. There is no history of kidney disease, CAD/MI, CVA or heart failure.  Gastroesophageal Reflux  He reports no belching, no coughing or no heartburn. This is a chronic problem. The current episode started more than 1 year ago. The problem occurs occasionally. The problem has been resolved. The symptoms are aggravated by certain foods. Risk factors include obesity. He has tried a PPI for the symptoms. The treatment provided moderate relief.  Hyperlipidemia  This is a chronic problem. The current episode started more than 1 year ago. The problem is controlled. Recent lipid tests were reviewed and are normal. Exacerbating diseases include obesity. Pertinent negatives include no shortness of breath. Current antihyperlipidemic treatment includes statins. The current treatment provides moderate improvement of lipids. Risk factors for coronary artery disease include dyslipidemia, family history, male sex, obesity and a sedentary lifestyle.      Review of Systems  Constitutional: Negative for malaise/fatigue.  Respiratory: Negative for cough and shortness of breath.   Gastrointestinal: Negative for heartburn.  Neurological: Negative for headaches.  All other systems reviewed and are negative.      Objective:   Physical Exam  Constitutional: He is oriented to person, place, and time. He appears well-developed and well-nourished. No  distress.  HENT:  Head: Normocephalic.  Right Ear: External ear normal.  Left Ear: External ear normal.  Nose: Nose normal.  Mouth/Throat: Posterior oropharyngeal erythema present.  Eyes: Pupils are equal, round, and reactive to light. Right eye exhibits no discharge. Left eye exhibits no discharge.  Neck: Normal range of motion. Neck supple. No thyromegaly present.  Cardiovascular: Normal rate, regular rhythm, normal heart sounds and intact distal pulses.  No murmur heard. Pulmonary/Chest: Effort normal and breath sounds normal. No respiratory distress. He has no wheezes.  Abdominal: Soft. Bowel sounds are normal. He exhibits no distension. There is no tenderness.  Musculoskeletal: Normal range of motion. He exhibits no edema or tenderness.  Neurological: He is alert and oriented to person, place, and time.  Skin: Skin is warm and dry. No rash noted. No erythema.  Psychiatric: He has a normal mood and affect. His behavior is normal. Judgment and thought content normal.  Vitals reviewed.     BP 122/72   Pulse 76   Temp 98.1 F (36.7 C) (Oral)   Ht 5' 10.5" (1.791 m)   Wt 236 lb (107 kg)   BMI 33.38 kg/m      Assessment & Plan:  1. Essential hypertension - CMP14+EGFR - CBC with Differential/Platelet - lisinopril-hydrochlorothiazide (PRINZIDE,ZESTORETIC) 20-12.5 MG tablet; Take 1 tablet daily by mouth.  Dispense: 90 tablet; Refill: 1 - amLODipine (NORVASC) 10 MG tablet; Take 1 tablet (10 mg total) daily by mouth.  Dispense: 90 tablet; Refill: 1  2. Gastroesophageal reflux disease with esophagitis - CMP14+EGFR - CBC with Differential/Platelet  3. Hyperlipidemia, unspecified hyperlipidemia type - CMP14+EGFR - CBC with Differential/Platelet - Lipid panel  4. Obesity (BMI 30-39.9) - CMP14+EGFR - CBC with Differential/Platelet  5.  Annual physical exam - CMP14+EGFR - CBC with Differential/Platelet - PSA, total and free - TSH - VITAMIN D 25 Hydroxy (Vit-D Deficiency,  Fractures) - lisinopril-hydrochlorothiazide (PRINZIDE,ZESTORETIC) 20-12.5 MG tablet; Take 1 tablet daily by mouth.  Dispense: 90 tablet; Refill: 1   Continue all meds Labs pending Health Maintenance reviewed Diet and exercise encouraged RTO 6 months   Evelina Dun, FNP

## 2017-11-09 NOTE — Patient Instructions (Signed)

## 2017-11-10 ENCOUNTER — Other Ambulatory Visit: Payer: Self-pay | Admitting: Family

## 2017-11-10 DIAGNOSIS — K760 Fatty (change of) liver, not elsewhere classified: Secondary | ICD-10-CM | POA: Insufficient documentation

## 2017-11-10 LAB — CBC WITH DIFFERENTIAL/PLATELET
BASOS ABS: 0.1 10*3/uL (ref 0.0–0.2)
Basos: 2 %
EOS (ABSOLUTE): 0.3 10*3/uL (ref 0.0–0.4)
Eos: 4 %
HEMOGLOBIN: 16.2 g/dL (ref 13.0–17.7)
Hematocrit: 47.6 % (ref 37.5–51.0)
Immature Grans (Abs): 0 10*3/uL (ref 0.0–0.1)
Immature Granulocytes: 0 %
LYMPHS ABS: 2.5 10*3/uL (ref 0.7–3.1)
LYMPHS: 31 %
MCH: 29.8 pg (ref 26.6–33.0)
MCHC: 34 g/dL (ref 31.5–35.7)
MCV: 88 fL (ref 79–97)
MONOCYTES: 11 %
Monocytes Absolute: 0.8 10*3/uL (ref 0.1–0.9)
Neutrophils Absolute: 4.3 10*3/uL (ref 1.4–7.0)
Neutrophils: 52 %
PLATELETS: 330 10*3/uL (ref 150–379)
RBC: 5.43 x10E6/uL (ref 4.14–5.80)
RDW: 13.4 % (ref 12.3–15.4)
WBC: 8 10*3/uL (ref 3.4–10.8)

## 2017-11-10 LAB — CMP14+EGFR
ALK PHOS: 99 IU/L (ref 39–117)
ALT: 110 IU/L — ABNORMAL HIGH (ref 0–44)
AST: 52 IU/L — AB (ref 0–40)
Albumin/Globulin Ratio: 1.4 (ref 1.2–2.2)
Albumin: 4.7 g/dL (ref 3.5–5.5)
BUN/Creatinine Ratio: 16 (ref 9–20)
BUN: 15 mg/dL (ref 6–24)
Bilirubin Total: 0.4 mg/dL (ref 0.0–1.2)
CALCIUM: 9.8 mg/dL (ref 8.7–10.2)
CHLORIDE: 98 mmol/L (ref 96–106)
CO2: 22 mmol/L (ref 20–29)
CREATININE: 0.93 mg/dL (ref 0.76–1.27)
GFR calc Af Amer: 115 mL/min/{1.73_m2} (ref >59)
GFR, EST NON AFRICAN AMERICAN: 99 mL/min/{1.73_m2} (ref >59)
GLUCOSE: 77 mg/dL (ref 65–99)
Globulin, Total: 3.3 g/dL (ref 1.5–4.5)
Potassium: 3.9 mmol/L (ref 3.5–5.2)
Sodium: 140 mmol/L (ref 134–144)
Total Protein: 8 g/dL (ref 6.0–8.5)

## 2017-11-10 LAB — LIPID PANEL
CHOLESTEROL TOTAL: 238 mg/dL — AB (ref 100–199)
Chol/HDL Ratio: 5.8 ratio — ABNORMAL HIGH (ref 0.0–5.0)
HDL: 41 mg/dL (ref >39)
LDL CALC: 140 mg/dL — AB (ref 0–99)
TRIGLYCERIDES: 284 mg/dL — AB (ref 0–149)
VLDL Cholesterol Cal: 57 mg/dL — ABNORMAL HIGH (ref 5–40)

## 2017-11-10 LAB — PSA, TOTAL AND FREE
PSA FREE PCT: 50 %
PSA, Free: 0.3 ng/mL
Prostate Specific Ag, Serum: 0.6 ng/mL (ref 0.0–4.0)

## 2017-11-10 LAB — TSH: TSH: 1.9 u[IU]/mL (ref 0.450–4.500)

## 2017-11-10 LAB — VITAMIN D 25 HYDROXY (VIT D DEFICIENCY, FRACTURES): VIT D 25 HYDROXY: 17.8 ng/mL — AB (ref 30.0–100.0)

## 2017-11-10 MED ORDER — ATORVASTATIN CALCIUM 40 MG PO TABS: 40.0000 mg | ORAL_TABLET | Freq: Every day | ORAL | 1 refills | Status: DC

## 2018-04-12 ENCOUNTER — Ambulatory Visit (INDEPENDENT_AMBULATORY_CARE_PROVIDER_SITE_OTHER): Payer: 59 | Admitting: Family Medicine

## 2018-04-12 ENCOUNTER — Encounter: Payer: Self-pay | Admitting: Family Medicine

## 2018-04-12 VITALS — BP 118/78 | HR 83 | Temp 99.5°F | Ht 70.0 in | Wt 242.0 lb

## 2018-04-12 DIAGNOSIS — H1013 Acute atopic conjunctivitis, bilateral: Secondary | ICD-10-CM | POA: Diagnosis not present

## 2018-04-12 DIAGNOSIS — W57XXXA Bitten or stung by nonvenomous insect and other nonvenomous arthropods, initial encounter: Secondary | ICD-10-CM

## 2018-04-12 MED ORDER — DOXYCYCLINE HYCLATE 100 MG PO TABS: 200.0000 mg | ORAL_TABLET | Freq: Once | ORAL | 0 refills | Status: AC

## 2018-04-12 MED ORDER — OLOPATADINE HCL 0.2 % OP SOLN: 1.0000 [drp] | Freq: Every day | OPHTHALMIC | 1 refills | Status: DC

## 2018-04-12 NOTE — Progress Notes (Signed)
   HPI  Patient presents today after tick bite also with itchy eyes.  Patient explains he removed the tick about 36 hours ago, is a small black tick.  He believes he picked it up about 24 hours prior to that. Denies any fever, chills, sweats. He does not have a rash or concerning area around the bite area.  He denies any arthralgias.  Has had itchy red eyes over the last few days.   PMH: Smoking status noted ROS: Per HPI  Objective: BP 118/78   Pulse 83   Temp 99.5 F (37.5 C) (Oral)   Ht 5\' 10"  (1.778 m)   Wt 242 lb (109.8 kg)   BMI 34.72 kg/m  Gen: NAD, alert, cooperative with exam HEENT: NCAT, bilateral conjunctival injection CV: RRR, good S1/S2, no murmur Resp: CTABL, no wheezes, non-labored Ext: No edema, warm Neuro: Alert and oriented, No gross deficits Skin:  L shoulder with enlarged red nodule where the tick was removed  Assessment and plan:  #Tick bite Treating with doxycycline 200 mg once for Lyme prophylaxis   #Allergic conjunctivitis Pataday drops   Meds ordered this encounter  Medications  . doxycycline (VIBRA-TABS) 100 MG tablet    Sig: Take 2 tablets (200 mg total) by mouth once for 1 dose. 1 po bid    Dispense:  2 tablet    Refill:  0  . Olopatadine HCl 0.2 % SOLN    Sig: Apply 1 drop to eye daily.    Dispense:  2.5 mL    Refill:  1    Murtis SinkSam Bernadene Garside, MD [facility] 04/12/2018, 5:33 PM

## 2018-04-12 NOTE — Progress Notes (Signed)
vc

## 2018-05-17 ENCOUNTER — Other Ambulatory Visit: Payer: Self-pay | Admitting: Family

## 2018-05-17 DIAGNOSIS — I10 Essential (primary) hypertension: Secondary | ICD-10-CM

## 2018-06-29 ENCOUNTER — Other Ambulatory Visit: Payer: Self-pay | Admitting: Family

## 2018-06-29 DIAGNOSIS — I10 Essential (primary) hypertension: Secondary | ICD-10-CM

## 2018-06-29 DIAGNOSIS — Z0000 Encounter for general adult medical examination without abnormal findings: Secondary | ICD-10-CM

## 2018-08-24 ENCOUNTER — Other Ambulatory Visit: Payer: Self-pay | Admitting: Family

## 2018-08-24 DIAGNOSIS — I10 Essential (primary) hypertension: Secondary | ICD-10-CM

## 2018-10-19 ENCOUNTER — Other Ambulatory Visit: Payer: Self-pay | Admitting: Family

## 2018-10-19 DIAGNOSIS — Z0000 Encounter for general adult medical examination without abnormal findings: Secondary | ICD-10-CM

## 2018-10-19 DIAGNOSIS — I10 Essential (primary) hypertension: Secondary | ICD-10-CM

## 2018-11-04 ENCOUNTER — Other Ambulatory Visit: Payer: Self-pay | Admitting: Family

## 2018-11-04 DIAGNOSIS — I10 Essential (primary) hypertension: Secondary | ICD-10-CM

## 2018-11-12 ENCOUNTER — Encounter: Payer: Self-pay | Admitting: Family

## 2018-11-12 ENCOUNTER — Ambulatory Visit (INDEPENDENT_AMBULATORY_CARE_PROVIDER_SITE_OTHER): Payer: 59 | Admitting: Family

## 2018-11-12 VITALS — BP 116/75 | HR 72 | Temp 97.1°F | Ht 70.0 in | Wt 239.0 lb

## 2018-11-12 DIAGNOSIS — Z0000 Encounter for general adult medical examination without abnormal findings: Secondary | ICD-10-CM

## 2018-11-12 DIAGNOSIS — E785 Hyperlipidemia, unspecified: Secondary | ICD-10-CM

## 2018-11-12 DIAGNOSIS — I10 Essential (primary) hypertension: Secondary | ICD-10-CM

## 2018-11-12 DIAGNOSIS — K2100 Gastro-esophageal reflux disease with esophagitis, without bleeding: Secondary | ICD-10-CM

## 2018-11-12 DIAGNOSIS — E669 Obesity, unspecified: Secondary | ICD-10-CM

## 2018-11-12 MED ORDER — AMLODIPINE BESYLATE 10 MG PO TABS: 10.0000 mg | ORAL_TABLET | Freq: Every day | ORAL | 4 refills | Status: DC

## 2018-11-12 MED ORDER — ATORVASTATIN CALCIUM 40 MG PO TABS: 40.0000 mg | ORAL_TABLET | Freq: Every day | ORAL | 4 refills | Status: DC

## 2018-11-12 MED ORDER — LISINOPRIL-HYDROCHLOROTHIAZIDE 20-12.5 MG PO TABS: 1.0000 | ORAL_TABLET | Freq: Every day | ORAL | 4 refills | Status: DC

## 2018-11-12 NOTE — Progress Notes (Signed)
Subjective:    Patient ID: Kevin Russo, male    DOB: [birth year], 47 y.o.   MRN: [id number]  Chief Complaint  Patient presents with  . Annual Exam   Pt presents to the office today for CPE.  Hypertension  This is a chronic problem. The current episode started more than 1 year ago. The problem has been resolved since onset. The problem is controlled. Pertinent negatives include no headaches, malaise/fatigue, peripheral edema or shortness of breath. Risk factors for coronary artery disease include dyslipidemia, obesity and male gender. The current treatment provides moderate improvement. There is no history of kidney disease, CAD/MI or heart failure.  Hyperlipidemia  This is a chronic problem. The current episode started more than 1 year ago. The problem is uncontrolled. Recent lipid tests were reviewed and are high. Exacerbating diseases include obesity. Pertinent negatives include no shortness of breath. Current antihyperlipidemic treatment includes statins. The current treatment provides moderate improvement of lipids. Risk factors for coronary artery disease include dyslipidemia, male sex, hypertension and a sedentary lifestyle.  Gastroesophageal Reflux  He reports no belching or no heartburn. This is a chronic problem. The current episode started more than 1 year ago. The problem occurs occasionally. He has tried an antacid and a diet change for the symptoms. The treatment provided moderate relief.      Review of Systems  Constitutional: Negative for malaise/fatigue.  Respiratory: Negative for shortness of breath.   Gastrointestinal: Negative for heartburn.  Neurological: Negative for headaches.  All other systems reviewed and are negative.      Objective:   Physical Exam  Constitutional: He is oriented to person, place, and time. He appears well-developed and well-nourished. No distress.  HENT:  Head: Normocephalic.  Right Ear: External ear normal.  Left Ear: External ear  normal.  Mouth/Throat: Oropharynx is clear and moist.  Eyes: Pupils are equal, round, and reactive to light. Right eye exhibits no discharge. Left eye exhibits no discharge.  Neck: Normal range of motion. Neck supple. No thyromegaly present.  Cardiovascular: Normal rate, regular rhythm, normal heart sounds and intact distal pulses.  No murmur heard. Pulmonary/Chest: Effort normal and breath sounds normal. No respiratory distress. He has no wheezes.  Abdominal: Soft. Bowel sounds are normal. He exhibits no distension. There is no tenderness.  Musculoskeletal: Normal range of motion. He exhibits no edema or tenderness.  Neurological: He is alert and oriented to person, place, and time. He has normal reflexes. No cranial nerve deficit.  Skin: Skin is warm and dry. No rash noted. No erythema.  Psychiatric: He has a normal mood and affect. His behavior is normal. Judgment and thought content normal.  Vitals reviewed.     BP 116/75   Pulse 72   Temp (!) 97.1 F (36.2 C) (Oral)   Ht '5\' 10"'  (1.778 m)   Wt 239 lb (108.4 kg)   BMI 34.29 kg/m      Assessment & Plan:  Kevin Russo comes in today with chief complaint of Annual Exam   Diagnosis and orders addressed:  1. Annual physical exam - CMP14+EGFR - CBC with Differential/Platelet - Lipid panel - TSH - PSA, total and free  2. Essential hypertension - CMP14+EGFR - CBC with Differential/Platelet - amLODipine (NORVASC) 10 MG tablet; Take 1 tablet (10 mg total) by mouth daily.  Dispense: 90 tablet; Refill: 4 - lisinopril-hydrochlorothiazide (PRINZIDE,ZESTORETIC) 20-12.5 MG tablet; Take 1 tablet by mouth daily.  Dispense: 90 tablet; Refill: 4  3. Gastroesophageal reflux disease with  esophagitis - CMP14+EGFR - CBC with Differential/Platelet  4. Hyperlipidemia, unspecified hyperlipidemia type - CMP14+EGFR - CBC with Differential/Platelet - Lipid panel - atorvastatin (LIPITOR) 40 MG tablet; Take 1 tablet (40 mg total) by mouth  daily.  Dispense: 90 tablet; Refill: 4  5. Obesity (BMI 30-39.9) - CMP14+EGFR - CBC with Differential/Platelet   Labs pending Health Maintenance reviewed Diet and exercise encouraged  Follow up plan: 1 year    Evelina Dun, FNP

## 2018-11-12 NOTE — Patient Instructions (Signed)

## 2018-11-13 ENCOUNTER — Other Ambulatory Visit: Payer: Self-pay | Admitting: Family

## 2018-11-13 LAB — CMP14+EGFR
A/G RATIO: 1.3 (ref 1.2–2.2)
ALK PHOS: 117 IU/L (ref 39–117)
ALT: 76 IU/L — AB (ref 0–44)
AST: 33 IU/L (ref 0–40)
Albumin: 4.5 g/dL (ref 3.5–5.5)
BILIRUBIN TOTAL: 0.5 mg/dL (ref 0.0–1.2)
BUN / CREAT RATIO: 15 (ref 9–20)
BUN: 15 mg/dL (ref 6–24)
CHLORIDE: 99 mmol/L (ref 96–106)
CO2: 20 mmol/L (ref 20–29)
Calcium: 9.7 mg/dL (ref 8.7–10.2)
Creatinine, Ser: 0.97 mg/dL (ref 0.76–1.27)
GFR calc Af Amer: 109 mL/min/{1.73_m2} (ref >59)
GFR calc non Af Amer: 94 mL/min/{1.73_m2} (ref >59)
GLUCOSE: 85 mg/dL (ref 65–99)
Globulin, Total: 3.5 g/dL (ref 1.5–4.5)
POTASSIUM: 3.9 mmol/L (ref 3.5–5.2)
Sodium: 138 mmol/L (ref 134–144)
Total Protein: 8 g/dL (ref 6.0–8.5)

## 2018-11-13 LAB — LIPID PANEL
Chol/HDL Ratio: 7.2 ratio — ABNORMAL HIGH (ref 0.0–5.0)
Cholesterol, Total: 251 mg/dL — ABNORMAL HIGH (ref 100–199)
HDL: 35 mg/dL — ABNORMAL LOW (ref >39)
LDL CALC: 172 mg/dL — AB (ref 0–99)
TRIGLYCERIDES: 221 mg/dL — AB (ref 0–149)
VLDL Cholesterol Cal: 44 mg/dL — ABNORMAL HIGH (ref 5–40)

## 2018-11-13 LAB — CBC WITH DIFFERENTIAL/PLATELET
BASOS ABS: 0.1 10*3/uL (ref 0.0–0.2)
Basos: 2 %
EOS (ABSOLUTE): 0.2 10*3/uL (ref 0.0–0.4)
Eos: 2 %
HEMOGLOBIN: 15.6 g/dL (ref 13.0–17.7)
Hematocrit: 46.1 % (ref 37.5–51.0)
Immature Grans (Abs): 0 10*3/uL (ref 0.0–0.1)
Immature Granulocytes: 0 %
LYMPHS ABS: 2 10*3/uL (ref 0.7–3.1)
Lymphs: 31 %
MCH: 29.1 pg (ref 26.6–33.0)
MCHC: 33.8 g/dL (ref 31.5–35.7)
MCV: 86 fL (ref 79–97)
Monocytes Absolute: 0.6 10*3/uL (ref 0.1–0.9)
Monocytes: 10 %
NEUTROS ABS: 3.5 10*3/uL (ref 1.4–7.0)
Neutrophils: 55 %
PLATELETS: 364 10*3/uL (ref 150–450)
RBC: 5.36 x10E6/uL (ref 4.14–5.80)
RDW: 12.9 % (ref 12.3–15.4)
WBC: 6.3 10*3/uL (ref 3.4–10.8)

## 2018-11-13 LAB — PSA, TOTAL AND FREE
PSA FREE PCT: 46.7 %
PSA FREE: 0.28 ng/mL
Prostate Specific Ag, Serum: 0.6 ng/mL (ref 0.0–4.0)

## 2018-11-13 LAB — TSH: TSH: 1.06 u[IU]/mL (ref 0.450–4.500)

## 2019-10-10 ENCOUNTER — Other Ambulatory Visit: Payer: Self-pay | Admitting: *Deleted

## 2019-10-10 DIAGNOSIS — I10 Essential (primary) hypertension: Secondary | ICD-10-CM

## 2019-10-10 MED ORDER — LISINOPRIL-HYDROCHLOROTHIAZIDE 20-12.5 MG PO TABS: 1.0000 | ORAL_TABLET | Freq: Every day | ORAL | 0 refills | Status: DC

## 2019-11-30 ENCOUNTER — Other Ambulatory Visit: Payer: Self-pay

## 2019-12-01 ENCOUNTER — Encounter: Payer: Self-pay | Admitting: Family

## 2019-12-01 ENCOUNTER — Ambulatory Visit (INDEPENDENT_AMBULATORY_CARE_PROVIDER_SITE_OTHER): Payer: 59 | Admitting: Family

## 2019-12-01 VITALS — BP 114/72 | HR 79 | Temp 98.2°F | Ht 70.0 in | Wt 236.0 lb

## 2019-12-01 DIAGNOSIS — Z0001 Encounter for general adult medical examination with abnormal findings: Secondary | ICD-10-CM | POA: Diagnosis not present

## 2019-12-01 DIAGNOSIS — E785 Hyperlipidemia, unspecified: Secondary | ICD-10-CM

## 2019-12-01 DIAGNOSIS — I10 Essential (primary) hypertension: Secondary | ICD-10-CM

## 2019-12-01 DIAGNOSIS — Z0000 Encounter for general adult medical examination without abnormal findings: Secondary | ICD-10-CM

## 2019-12-01 MED ORDER — LISINOPRIL-HYDROCHLOROTHIAZIDE 20-12.5 MG PO TABS: 1.0000 | ORAL_TABLET | Freq: Every day | ORAL | 4 refills | Status: DC

## 2019-12-01 MED ORDER — ATORVASTATIN CALCIUM 40 MG PO TABS: 40.0000 mg | ORAL_TABLET | Freq: Every day | ORAL | 4 refills | Status: DC

## 2019-12-01 MED ORDER — AMLODIPINE BESYLATE 10 MG PO TABS: 10.0000 mg | ORAL_TABLET | Freq: Every day | ORAL | 4 refills | Status: DC

## 2019-12-01 NOTE — Patient Instructions (Signed)

## 2019-12-01 NOTE — Progress Notes (Signed)
Subjective:    Patient ID: JESHAWN MELUCCI, male    DOB: 1971/05/01, 48 y.o.   MRN: [id number]  Chief Complaint  Patient presents with  . Annual Exam   PT presents to the office today for CPE.  Hypertension This is a chronic problem. The current episode started more than 1 year ago. The problem has been resolved since onset. The problem is controlled. Associated symptoms include peripheral edema ("at the end of the day"). Pertinent negatives include no headaches or malaise/fatigue. Risk factors for coronary artery disease include dyslipidemia, obesity and male gender. Past treatments include calcium channel blockers, ACE inhibitors and diuretics. The current treatment provides moderate improvement.  Hyperlipidemia This is a chronic problem. The current episode started more than 1 year ago. The problem is controlled. Recent lipid tests were reviewed and are normal. Current antihyperlipidemic treatment includes statins. The current treatment provides moderate improvement of lipids. Risk factors for coronary artery disease include dyslipidemia, male sex, hypertension and a sedentary lifestyle.      Review of Systems  Constitutional: Negative for malaise/fatigue.  Neurological: Negative for headaches.  All other systems reviewed and are negative.  History reviewed. No pertinent family history.  Social History   Socioeconomic History  . Marital status: Married    Spouse name: Not on file  . Number of children: Not on file  . Years of education: Not on file  . Highest education level: Not on file  Occupational History  . Not on file  Tobacco Use  . Smoking status: Never Smoker  . Smokeless tobacco: Never Used  Substance and Sexual Activity  . Alcohol use: Yes    Comment: Occasionally  . Drug use: No  . Sexual activity: Not on file  Other Topics Concern  . Not on file  Social History Narrative  . Not on file   Social Determinants of Health   Financial Resource Strain:   .  Difficulty of Paying Living Expenses: Not on file  Food Insecurity:   . Worried About Charity fundraiser in the Last Year: Not on file  . Ran Out of Food in the Last Year: Not on file  Transportation Needs:   . Lack of Transportation (Medical): Not on file  . Lack of Transportation (Non-Medical): Not on file  Physical Activity:   . Days of Exercise per Week: Not on file  . Minutes of Exercise per Session: Not on file  Stress:   . Feeling of Stress : Not on file  Social Connections:   . Frequency of Communication with Friends and Family: Not on file  . Frequency of Social Gatherings with Friends and Family: Not on file  . Attends Religious Services: Not on file  . Active Member of Clubs or Organizations: Not on file  . Attends Archivist Meetings: Not on file  . Marital Status: Not on file       Objective:   Physical Exam Vitals reviewed.  Constitutional:      General: He is not in acute distress.    Appearance: He is well-developed.  HENT:     Head: Normocephalic.     Right Ear: Tympanic membrane normal.     Left Ear: Tympanic membrane normal.  Eyes:     General:        Right eye: No discharge.        Left eye: No discharge.     Pupils: Pupils are equal, round, and reactive to light.  Neck:  Thyroid: No thyromegaly.  Cardiovascular:     Rate and Rhythm: Normal rate and regular rhythm.     Heart sounds: Normal heart sounds. No murmur.  Pulmonary:     Effort: Pulmonary effort is normal. No respiratory distress.     Breath sounds: Normal breath sounds. No wheezing.  Abdominal:     General: Bowel sounds are normal. There is no distension.     Palpations: Abdomen is soft.     Tenderness: There is no abdominal tenderness.  Musculoskeletal:        General: No tenderness. Normal range of motion.     Cervical back: Normal range of motion and neck supple.  Skin:    General: Skin is warm and dry.     Findings: No erythema or rash.  Neurological:     Mental  Status: He is alert and oriented to person, place, and time.     Cranial Nerves: No cranial nerve deficit.     Deep Tendon Reflexes: Reflexes are normal and symmetric.  Psychiatric:        Behavior: Behavior normal.        Thought Content: Thought content normal.        Judgment: Judgment normal.       BP 114/72   Pulse 79   Temp 98.2 F (36.8 C) (Temporal)   Ht '5\' 10"'  (1.778 m)   Wt 236 lb (107 kg)   SpO2 99%   BMI 33.86 kg/m      Assessment & Plan:  VAMSI APFEL comes in today with chief complaint of Annual Exam   Diagnosis and orders addressed:  1. Essential hypertension - amLODipine (NORVASC) 10 MG tablet; Take 1 tablet (10 mg total) by mouth daily.  Dispense: 90 tablet; Refill: 4 - lisinopril-hydrochlorothiazide (ZESTORETIC) 20-12.5 MG tablet; Take 1 tablet by mouth daily. (Needs to be seen before next refill)  Dispense: 90 tablet; Refill: 4 - CMP14+EGFR - CBC with Differential/Platelet  2. Hyperlipidemia, unspecified hyperlipidemia type - atorvastatin (LIPITOR) 40 MG tablet; Take 1 tablet (40 mg total) by mouth daily.  Dispense: 90 tablet; Refill: 4 - CMP14+EGFR - CBC with Differential/Platelet - Lipid panel  3. Annual physical exam - CMP14+EGFR - CBC with Differential/Platelet - Lipid panel - PSA, total and free - TSH   Labs pending Health Maintenance reviewed Diet and exercise encouraged  Follow up plan: 1 year    Evelina Dun, FNP

## 2019-12-02 LAB — LIPID PANEL
Chol/HDL Ratio: 3.8 ratio (ref 0.0–5.0)
Cholesterol, Total: 150 mg/dL (ref 100–199)
HDL: 39 mg/dL — ABNORMAL LOW (ref >39)
LDL Chol Calc (NIH): 71 mg/dL (ref 0–99)
Triglycerides: 244 mg/dL — ABNORMAL HIGH (ref 0–149)
VLDL Cholesterol Cal: 40 mg/dL (ref 5–40)

## 2019-12-02 LAB — CBC WITH DIFFERENTIAL/PLATELET
Basophils Absolute: 0.1 10*3/uL (ref 0.0–0.2)
Basos: 1 %
EOS (ABSOLUTE): 0.1 10*3/uL (ref 0.0–0.4)
Eos: 1 %
Hematocrit: 46.5 % (ref 37.5–51.0)
Hemoglobin: 16 g/dL (ref 13.0–17.7)
Immature Grans (Abs): 0 10*3/uL (ref 0.0–0.1)
Immature Granulocytes: 0 %
Lymphocytes Absolute: 2.3 10*3/uL (ref 0.7–3.1)
Lymphs: 27 %
MCH: 30 pg (ref 26.6–33.0)
MCHC: 34.4 g/dL (ref 31.5–35.7)
MCV: 87 fL (ref 79–97)
Monocytes Absolute: 1 10*3/uL — ABNORMAL HIGH (ref 0.1–0.9)
Monocytes: 11 %
Neutrophils Absolute: 5.1 10*3/uL (ref 1.4–7.0)
Neutrophils: 60 %
Platelets: 386 10*3/uL (ref 150–450)
RBC: 5.34 x10E6/uL (ref 4.14–5.80)
RDW: 12.6 % (ref 11.6–15.4)
WBC: 8.6 10*3/uL (ref 3.4–10.8)

## 2019-12-02 LAB — PSA, TOTAL AND FREE
PSA, Free Pct: 62 %
PSA, Free: 0.31 ng/mL
Prostate Specific Ag, Serum: 0.5 ng/mL (ref 0.0–4.0)

## 2019-12-02 LAB — CMP14+EGFR
ALT: 66 IU/L — ABNORMAL HIGH (ref 0–44)
AST: 31 IU/L (ref 0–40)
Albumin/Globulin Ratio: 1.5 (ref 1.2–2.2)
Albumin: 4.8 g/dL (ref 4.0–5.0)
Alkaline Phosphatase: 148 IU/L — ABNORMAL HIGH (ref 39–117)
BUN/Creatinine Ratio: 16 (ref 9–20)
BUN: 18 mg/dL (ref 6–24)
Bilirubin Total: 0.4 mg/dL (ref 0.0–1.2)
CO2: 22 mmol/L (ref 20–29)
Calcium: 9.8 mg/dL (ref 8.7–10.2)
Chloride: 98 mmol/L (ref 96–106)
Creatinine, Ser: 1.12 mg/dL (ref 0.76–1.27)
GFR calc Af Amer: 91 mL/min/{1.73_m2} (ref >59)
GFR calc non Af Amer: 78 mL/min/{1.73_m2} (ref >59)
Globulin, Total: 3.1 g/dL (ref 1.5–4.5)
Glucose: 87 mg/dL (ref 65–99)
Potassium: 3.9 mmol/L (ref 3.5–5.2)
Sodium: 137 mmol/L (ref 134–144)
Total Protein: 7.9 g/dL (ref 6.0–8.5)

## 2019-12-02 LAB — TSH: TSH: 1.49 u[IU]/mL (ref 0.450–4.500)

## 2019-12-07 ENCOUNTER — Other Ambulatory Visit: Payer: Self-pay | Admitting: Family

## 2019-12-07 DIAGNOSIS — R748 Abnormal levels of other serum enzymes: Secondary | ICD-10-CM

## 2019-12-08 ENCOUNTER — Encounter: Payer: Self-pay | Admitting: Physician Assistant

## 2020-01-02 ENCOUNTER — Ambulatory Visit: Payer: Managed Care, Other (non HMO) | Admitting: Physician Assistant

## 2020-01-11 ENCOUNTER — Ambulatory Visit: Payer: Managed Care, Other (non HMO) | Admitting: Physician Assistant

## 2020-01-25 ENCOUNTER — Other Ambulatory Visit: Payer: Self-pay

## 2020-01-25 ENCOUNTER — Ambulatory Visit (INDEPENDENT_AMBULATORY_CARE_PROVIDER_SITE_OTHER): Payer: 59 | Admitting: Physician Assistant

## 2020-01-25 ENCOUNTER — Other Ambulatory Visit (INDEPENDENT_AMBULATORY_CARE_PROVIDER_SITE_OTHER): Payer: 59

## 2020-01-25 ENCOUNTER — Encounter: Payer: Self-pay | Admitting: Physician Assistant

## 2020-01-25 VITALS — BP 110/62 | HR 80 | Temp 97.9°F | Ht 70.0 in | Wt 236.5 lb

## 2020-01-25 DIAGNOSIS — Z01818 Encounter for other preprocedural examination: Secondary | ICD-10-CM

## 2020-01-25 DIAGNOSIS — Z1211 Encounter for screening for malignant neoplasm of colon: Secondary | ICD-10-CM | POA: Diagnosis not present

## 2020-01-25 DIAGNOSIS — R7989 Other specified abnormal findings of blood chemistry: Secondary | ICD-10-CM

## 2020-01-25 LAB — IGA: IgA: 426 mg/dL — ABNORMAL HIGH (ref 68–378)

## 2020-01-25 LAB — IBC + FERRITIN
Ferritin: 194.3 ng/mL (ref 22.0–322.0)
Iron: 74 ug/dL (ref 42–165)
Saturation Ratios: 23.2 % (ref 20.0–50.0)
Transferrin: 228 mg/dL (ref 212.0–360.0)

## 2020-01-25 LAB — PROTIME-INR
INR: 1.1 ratio — ABNORMAL HIGH (ref 0.8–1.0)
Prothrombin Time: 12.7 s (ref 9.6–13.1)

## 2020-01-25 LAB — HEPATIC FUNCTION PANEL
ALT: 59 U/L — ABNORMAL HIGH (ref 0–53)
AST: 28 U/L (ref 0–37)
Albumin: 4.7 g/dL (ref 3.5–5.2)
Alkaline Phosphatase: 138 U/L — ABNORMAL HIGH (ref 39–117)
Bilirubin, Direct: 0.1 mg/dL (ref 0.0–0.3)
Total Bilirubin: 0.6 mg/dL (ref 0.2–1.2)
Total Protein: 8.4 g/dL — ABNORMAL HIGH (ref 6.0–8.3)

## 2020-01-25 MED ORDER — NA SULFATE-K SULFATE-MG SULF 17.5-3.13-1.6 GM/177ML PO SOLN: 1.0000 | Freq: Once | ORAL | 0 refills | Status: AC

## 2020-01-25 NOTE — Progress Notes (Signed)
 Chief Complaint: Elevated LFTs   HPI:    Kevin Russo is a 49-year-old Caucasian male with a past medical history as listed below, who was referred to me by Hawks, [name] A, FNP for a complaint of elevated LFTs.    11/10/2016 right upper quadrant ultrasound with mildly increased echogenicity of the liver parenchyma suggesting hepatic steatosis as well as an incidental note of a 4 mm gallbladder polyp.    12/01/2019 CMP with an alk phos of 148, AST normal at 31 and ALT minimally elevated at 66.  1 year earlier alk phos normal at 117, AST 33, ALT 76.  CBC normal.  Lipid panel with elevated triglycerides at 244.  TSH normal.    Today, the patient presents clinic and explains that he has been feeling well other than the fact that he has noticed that sometimes on a weekend watching the football game he used to drink a sixpack of beer occasionally maybe once or twice a month and now he can only drink 3 beers and he feels "absolutely horrible for a whole day afterwards".  Tells me due to this he has not drank at all in the past couple of months.    Denies fever, chills, weight loss, anorexia, nausea, vomiting, heartburn, reflux, change in bowel habits, abdominal pain, family history of liver disease, history of IV drug use, use of excessive Tylenol, use of herbs or supplements or history of hepatitis or blood transfusions.     Past Medical History:  Diagnosis Date  . Elevated liver enzymes   . Hyperlipemia   . Hypertension     Past Surgical History:  Procedure Laterality Date  . NO PAST SURGERIES      Current Outpatient Medications  Medication Sig Dispense Refill  . amLODipine (NORVASC) 10 MG tablet Take 1 tablet (10 mg total) by mouth daily. 90 tablet 4  . atorvastatin (LIPITOR) 40 MG tablet Take 1 tablet (40 mg total) by mouth daily. 90 tablet 4  . lisinopril-hydrochlorothiazide (ZESTORETIC) 20-12.5 MG tablet Take 1 tablet by mouth daily. (Needs to be seen before next refill) 90 tablet 4   No  current facility-administered medications for this visit.    Allergies as of 01/25/2020 - Review Complete 12/01/2019  Allergen Reaction Noted  . [place] Swelling 01/02/2019    No family history on file.  Social History   Socioeconomic History  . Marital status: Married    Spouse name: Not on file  . Number of children: Not on file  . Years of education: Not on file  . Highest education level: Not on file  Occupational History  . Not on file  Tobacco Use  . Smoking status: Never Smoker  . Smokeless tobacco: Never Used  Substance and Sexual Activity  . Alcohol use: Yes    Comment: Occasionally  . Drug use: No  . Sexual activity: Not on file  Other Topics Concern  . Not on file  Social History Narrative  . Not on file   Social Determinants of Health   Financial Resource Strain:   . Difficulty of Paying Living Expenses: Not on file  Food Insecurity:   . Worried About Running Out of Food in the Last Year: Not on file  . Ran Out of Food in the Last Year: Not on file  Transportation Needs:   . Lack of Transportation (Medical): Not on file  . Lack of Transportation (Non-Medical): Not on file  Physical Activity:   . Days of Exercise per Week:   Not on file  . Minutes of Exercise per Session: Not on file  Stress:   . Feeling of Stress : Not on file  Social Connections:   . Frequency of Communication with Friends and Family: Not on file  . Frequency of Social Gatherings with Friends and Family: Not on file  . Attends Religious Services: Not on file  . Active Member of Clubs or Organizations: Not on file  . Attends Archivist Meetings: Not on file  . Marital Status: Not on file  Intimate Partner Violence:   . Fear of Current or Ex-Partner: Not on file  . Emotionally Abused: Not on file  . Physically Abused: Not on file  . Sexually Abused: Not on file    Review of Systems:    Constitutional: No weight loss, fever or chills Skin: No rash  Cardiovascular: No  chest pain Respiratory: No SOB  Gastrointestinal: See HPI and otherwise negative Genitourinary: No dysuria  Neurological: No headache Musculoskeletal: No new muscle or joint pain Hematologic: No bleeding Psychiatric: No history of depression or anxiety   Physical Exam:  Vital signs: BP 110/62   Pulse 80   Temp 97.9 F (36.6 C)   Ht 5' 10" (1.778 m)   Wt 236 lb 8 oz (107.3 kg)   BMI 33.93 kg/m   Constitutional:   Pleasant Caucasian male appears to be in NAD, Well developed, Well nourished, alert and cooperative Head:  Normocephalic and atraumatic. Eyes:   PEERL, EOMI. No icterus. Conjunctiva pink. Ears:  Normal auditory acuity. Neck:  Supple Throat: Oral cavity and pharynx without inflammation, swelling or lesion.  Respiratory: Respirations even and unlabored. Lungs clear to auscultation bilaterally.   No wheezes, crackles, or rhonchi.  Cardiovascular: Normal S1, S2. No MRG. Regular rate and rhythm. No peripheral edema, cyanosis or pallor.  Gastrointestinal:  Soft, nondistended, nontender. No rebound or guarding. Normal bowel sounds. No appreciable masses or hepatomegaly. Rectal:  Not performed.  Msk:  Symmetrical without gross deformities. Without edema, no deformity or joint abnormality.  Neurologic:  Alert and  oriented x4;  grossly normal neurologically.  Skin:   Dry and intact without significant lesions or rashes. Psychiatric:  Demonstrates good judgement and reason without abnormal affect or behaviors.  MOST RECENT LABS AND IMAGING: CBC    Component Value Date/Time   WBC 8.6 12/01/2019 1601   WBC 17.3 (H) 06/09/2007 2215   RBC 5.34 12/01/2019 1601   RBC 5.34 06/09/2007 2215   HGB 16.0 12/01/2019 1601   HCT 46.5 12/01/2019 1601   PLT 386 12/01/2019 1601   MCV 87 12/01/2019 1601   MCH 30.0 12/01/2019 1601   MCHC 34.4 12/01/2019 1601   MCHC 35.5 06/09/2007 2215   RDW 12.6 12/01/2019 1601   LYMPHSABS 2.3 12/01/2019 1601   MONOABS 0.9 (H) 06/09/2007 2215    EOSABS 0.1 12/01/2019 1601   BASOSABS 0.1 12/01/2019 1601    CMP     Component Value Date/Time   NA 137 12/01/2019 1601   K 3.9 12/01/2019 1601   CL 98 12/01/2019 1601   CO2 22 12/01/2019 1601   GLUCOSE 87 12/01/2019 1601   GLUCOSE 123 (H) 06/09/2007 2215   BUN 18 12/01/2019 1601   CREATININE 1.12 12/01/2019 1601   CALCIUM 9.8 12/01/2019 1601   PROT 7.9 12/01/2019 1601   ALBUMIN 4.8 12/01/2019 1601   AST 31 12/01/2019 1601   ALT 66 (H) 12/01/2019 1601   ALKPHOS 148 (H) 12/01/2019 1601   BILITOT 0.4 12/01/2019  1601   GFRNONAA 78 12/01/2019 1601   GFRAA 91 12/01/2019 1601    Assessment: 1.  Elevated LFTs: Elevated over the past couple of years, up and down, most recently alk phos 148, ALT 66, previous right upper quadrant ultrasound 2017 with fatty liver, patient is also on a statin 2.  Screening for colorectal cancer: Patient is now 49 years old and past due for his screening colonoscopy  Plan: 1.  Ordered further liver serologies to include alpha-1 antitrypsin, ANA, ASMA, AMA, PT/INR, iron studies, IgG, repeat hepatic panel, ceruloplasmin and hepatitis labs 2.  Ordered repeat right upper quadrant ultrasound 3.  Discussed known fatty liver which could be the cause of minimally elevated LFTs as well as statin which could be contributing. 4.  Scheduled the patient for a screening colonoscopy in the [facility] with Dr. Danis.  Did discuss risks, benefits, limitations and alternatives and patient agrees to proceed.  Patient will be Covid tested 2 days prior to time of procedure. 5.  Patient to follow in clinic per recommendations after time of procedure with Dr. Danis or myself.  Jennifer Lemmon, PA-C [facility] 01/25/2020, 1:23 PM  Cc: Hawks, [name] A, FNP 

## 2020-01-25 NOTE — Patient Instructions (Signed)
If you are age 47 or older, your body mass index should be between 23-30. Your Body mass index is 33.93 kg/m. If this is out of the aforementioned range listed, please consider follow up with your Primary Care Provider.  If you are age 54 or younger, your body mass index should be between 19-25. Your Body mass index is 33.93 kg/m. If this is out of the aformentioned range listed, please consider follow up with your Primary Care Provider.   Your provider has requested that you go to the basement level for lab work before leaving today. Press "B" on the elevator. The lab is located at the first door on the left as you exit the elevator.  You have been scheduled for an abdominal ultrasound at [facility] (1st floor of hospital) on Monday 01/30/20 at 9:30 am. Please arrive 15 minutes prior to your appointment for registration. Make certain not to have anything to eat or drink 6 hours prior to your appointment. Should you need to reschedule your appointment, please contact radiology at [phone]. This test typically takes about 30 minutes to perform.  You have been scheduled for a colonoscopy. Please follow written instructions given to you at your visit today.  Please pick up your prep supplies at the pharmacy within the next 1-3 days. If you use inhalers (even only as needed), please bring them with you on the day of your procedure.

## 2020-01-26 NOTE — Progress Notes (Signed)
____________________________________________________________  Attending physician addendum:  Thank you for sending this case to me. I have reviewed the entire note, and the outlined plan seems appropriate.  Agree with serologic workup.  Seems most likely fatty liver.  Best to avoid alcohol completely, at least for now while workup pending.  Amada Jupiter, MD  ____________________________________________________________

## 2020-01-28 LAB — HEPATITIS A ANTIBODY, TOTAL: Hepatitis A AB,Total: NONREACTIVE

## 2020-01-28 LAB — HEPATITIS C ANTIBODY
Hepatitis C Ab: NONREACTIVE
SIGNAL TO CUT-OFF: 0.02 (ref <1.00)

## 2020-01-28 LAB — HEPATITIS B SURFACE ANTIBODY,QUALITATIVE: Hep B S Ab: NONREACTIVE

## 2020-01-28 LAB — HEPATITIS B SURFACE ANTIGEN: Hepatitis B Surface Ag: NONREACTIVE

## 2020-01-28 LAB — MITOCHONDRIAL ANTIBODIES: Mitochondrial M2 Ab, IgG: <=20 U

## 2020-01-28 LAB — ALPHA-1-ANTITRYPSIN: A-1 Antitrypsin, Ser: 171 mg/dL (ref 83–199)

## 2020-01-28 LAB — ANA: Anti Nuclear Antibody (ANA): NEGATIVE

## 2020-01-28 LAB — CERULOPLASMIN: Ceruloplasmin: 36 mg/dL (ref 18–36)

## 2020-01-28 LAB — ANTI-SMOOTH MUSCLE ANTIBODY, IGG: Actin (Smooth Muscle) Antibody (IGG): <20 U (ref <20)

## 2020-01-30 ENCOUNTER — Telehealth: Payer: Self-pay

## 2020-01-30 ENCOUNTER — Ambulatory Visit (HOSPITAL_COMMUNITY): Payer: Managed Care, Other (non HMO)

## 2020-01-30 NOTE — Telephone Encounter (Signed)
Left message for patient to please call back. 

## 2020-02-03 ENCOUNTER — Ambulatory Visit (HOSPITAL_COMMUNITY): Payer: Managed Care, Other (non HMO)

## 2020-02-10 ENCOUNTER — Ambulatory Visit (HOSPITAL_COMMUNITY): Admission: RE | Admit: 2020-02-10 | Payer: 59 | Source: Ambulatory Visit

## 2020-02-21 ENCOUNTER — Ambulatory Visit (INDEPENDENT_AMBULATORY_CARE_PROVIDER_SITE_OTHER): Payer: 59

## 2020-02-21 ENCOUNTER — Encounter: Payer: Self-pay | Admitting: Gastroenterology

## 2020-02-21 ENCOUNTER — Other Ambulatory Visit: Payer: Self-pay | Admitting: Gastroenterology

## 2020-02-21 ENCOUNTER — Other Ambulatory Visit: Payer: Self-pay

## 2020-02-21 DIAGNOSIS — Z1159 Encounter for screening for other viral diseases: Secondary | ICD-10-CM

## 2020-02-22 ENCOUNTER — Telehealth: Payer: Self-pay | Admitting: Gastroenterology

## 2020-02-22 LAB — SARS CORONAVIRUS 2 (TAT 6-24 HRS): SARS Coronavirus 2: NEGATIVE

## 2020-02-23 ENCOUNTER — Encounter: Payer: 59 | Admitting: Gastroenterology

## 2020-09-18 ENCOUNTER — Other Ambulatory Visit: Payer: Self-pay

## 2020-09-18 ENCOUNTER — Ambulatory Visit (INDEPENDENT_AMBULATORY_CARE_PROVIDER_SITE_OTHER): Payer: 59 | Admitting: Family

## 2020-09-18 ENCOUNTER — Encounter: Payer: Self-pay | Admitting: Family

## 2020-09-18 ENCOUNTER — Ambulatory Visit (INDEPENDENT_AMBULATORY_CARE_PROVIDER_SITE_OTHER): Payer: 59

## 2020-09-18 VITALS — BP 110/76 | HR 77 | Temp 98.1°F | Ht 70.0 in | Wt 237.6 lb

## 2020-09-18 DIAGNOSIS — M545 Low back pain, unspecified: Secondary | ICD-10-CM

## 2020-09-18 LAB — MICROSCOPIC EXAMINATION
Bacteria, UA: NONE SEEN
Crystals: POSITIVE — AB
Epithelial Cells (non renal): NONE SEEN /hpf (ref 0–10)
RBC, Urine: NONE SEEN /hpf (ref 0–2)

## 2020-09-18 LAB — URINALYSIS, COMPLETE
Bilirubin, UA: NEGATIVE
Glucose, UA: NEGATIVE
Ketones, UA: NEGATIVE
Leukocytes,UA: NEGATIVE
Nitrite, UA: NEGATIVE
Protein,UA: NEGATIVE
Specific Gravity, UA: >1.03 — ABNORMAL HIGH (ref 1.005–1.030)
Urobilinogen, Ur: 0.2 mg/dL (ref 0.2–1.0)
pH, UA: 5.5 (ref 5.0–7.5)

## 2020-09-18 MED ORDER — DICLOFENAC SODIUM 75 MG PO TBEC: 75.0000 mg | DELAYED_RELEASE_TABLET | Freq: Two times a day (BID) | ORAL | 0 refills | Status: DC

## 2020-09-18 MED ORDER — BACLOFEN 10 MG PO TABS: 10.0000 mg | ORAL_TABLET | Freq: Three times a day (TID) | ORAL | 0 refills | Status: DC

## 2020-09-18 MED ORDER — PREDNISONE 10 MG (21) PO TBPK: ORAL_TABLET | ORAL | 0 refills | Status: DC

## 2020-09-18 NOTE — Progress Notes (Signed)
Subjective:    Patient ID: Kevin Russo, male    DOB: 04-May-1971, 49 y.o.   MRN: [id number]  Chief Complaint  Patient presents with  . Back Pain    lower back pain been going on for 6 months. After sitting long peroids of time make the pain worse. Pain does not radiate any where.   Pt presents to the office today with lower back pain.  Back Pain This is a new problem. The current episode started more than 1 month ago. The problem occurs intermittently. The problem has been gradually worsening since onset. The pain is present in the lumbar spine. The quality of the pain is described as aching. The pain does not radiate. The pain is at a severity of 7/10. Exacerbated by: after sitting and standing from a sitting postion. Pertinent negatives include no dysuria, fever, numbness, paresis or weakness. Risk factors include obesity and sedentary lifestyle. He has tried nothing for the symptoms. The treatment provided no relief.      Review of Systems  Constitutional: Negative for fever.  Genitourinary: Negative for dysuria.  Musculoskeletal: Positive for back pain.  Neurological: Negative for weakness and numbness.  All other systems reviewed and are negative.      Objective:   Physical Exam Vitals reviewed.  Constitutional:      General: He is not in acute distress.    Appearance: He is well-developed.  HENT:     Head: Normocephalic.     Right Ear: Tympanic membrane normal.     Left Ear: Tympanic membrane normal.  Eyes:     General:        Right eye: No discharge.        Left eye: No discharge.     Pupils: Pupils are equal, round, and reactive to light.  Neck:     Thyroid: No thyromegaly.  Cardiovascular:     Rate and Rhythm: Normal rate and regular rhythm.     Heart sounds: Normal heart sounds. No murmur heard.   Pulmonary:     Effort: Pulmonary effort is normal. No respiratory distress.     Breath sounds: Normal breath sounds. No wheezing.  Abdominal:     General:  Bowel sounds are normal. There is no distension.     Palpations: Abdomen is soft.     Tenderness: There is no abdominal tenderness.  Musculoskeletal:        General: No tenderness. Normal range of motion.     Cervical back: Normal range of motion and neck supple.     Comments: Negative SLR, mild lumbar pain with flexion  Skin:    General: Skin is warm and dry.     Findings: No erythema or rash.  Neurological:     Mental Status: He is alert and oriented to person, place, and time.     Cranial Nerves: No cranial nerve deficit.     Deep Tendon Reflexes: Reflexes are normal and symmetric.  Psychiatric:        Behavior: Behavior normal.        Thought Content: Thought content normal.        Judgment: Judgment normal.          BP 110/76   Pulse 77   Temp 98.1 F (36.7 C) (Temporal)   Ht 5\' 10"  (1.778 m)   Wt 237 lb 9.6 oz (107.8 kg)   BMI 34.09 kg/m   Assessment & Plan:  Kevin Russo comes in today with chief complaint  of Back Pain (lower back pain been going on for 6 months. After sitting long peroids of time make the pain worse. Pain does not radiate any where.)   Diagnosis and orders addressed:  1. Acute low back pain, unspecified back pain laterality, unspecified whether sciatica present Rest Ice ROM exercises encouraged No other NSAID"s while taking diclofenac Sedation precautions discussed RTO if symptoms worsen or do not improve  - Urinalysis, Complete - diclofenac (VOLTAREN) 75 MG EC tablet; Take 1 tablet (75 mg total) by mouth 2 (two) times daily.  Dispense: 30 tablet; Refill: 0 - predniSONE (STERAPRED UNI-PAK 21 TAB) 10 MG (21) TBPK tablet; Use as directed  Dispense: 21 tablet; Refill: 0 - baclofen (LIORESAL) 10 MG tablet; Take 1 tablet (10 mg total) by mouth 3 (three) times daily.  Dispense: 30 each; Refill: 0 - DG Lumbar Spine 2-3 Views; Future     Jannifer Adolfo, FNP

## 2020-09-18 NOTE — Patient Instructions (Signed)
Acute Back Pain, Adult Acute back pain is sudden and usually short-lived. It is often caused by an injury to the muscles and tissues in the back. The injury may result from:  A muscle or ligament getting overstretched or torn (strained). Ligaments are tissues that connect bones to each other. Lifting something improperly can cause a back strain.  Wear and tear (degeneration) of the spinal disks. Spinal disks are circular tissue that provides cushioning between the bones of the spine (vertebrae).  Twisting motions, such as while playing sports or doing yard work.  A hit to the back.  Arthritis. You may have a physical exam, lab tests, and imaging tests to find the cause of your pain. Acute back pain usually goes away with rest and home care. Follow these instructions at home: Managing pain, stiffness, and swelling  Take over-the-counter and prescription medicines only as told by your health care provider.  Your health care provider may recommend applying ice during the first 24-48 hours after your pain starts. To do this: ? Put ice in a plastic bag. ? Place a towel between your skin and the bag. ? Leave the ice on for 20 minutes, 2-3 times a day.  If directed, apply heat to the affected area as often as told by your health care provider. Use the heat source that your health care provider recommends, such as a moist heat pack or a heating pad. ? Place a towel between your skin and the heat source. ? Leave the heat on for 20-30 minutes. ? Remove the heat if your skin turns bright red. This is especially important if you are unable to feel pain, heat, or cold. You have a greater risk of getting burned. Activity   Do not stay in bed. Staying in bed for more than 1-2 days can delay your recovery.  Sit up and stand up straight. Avoid leaning forward when you sit, or hunching over when you stand. ? If you work at a desk, sit close to it so you do not need to lean over. Keep your chin tucked  in. Keep your neck drawn back, and keep your elbows bent at a right angle. Your arms should look like the letter "L." ? Sit high and close to the steering wheel when you drive. Add lower back (lumbar) support to your car seat, if needed.  Take short walks on even surfaces as soon as you are able. Try to increase the length of time you walk each day.  Do not sit, drive, or stand in one place for more than 30 minutes at a time. Sitting or standing for long periods of time can put stress on your back.  Do not drive or use heavy machinery while taking prescription pain medicine.  Use proper lifting techniques. When you bend and lift, use positions that put less stress on your back: ? Bend your knees. ? Keep the load close to your body. ? Avoid twisting.  Exercise regularly as told by your health care provider. Exercising helps your back heal faster and helps prevent back injuries by keeping muscles strong and flexible.  Work with a physical therapist to make a safe exercise program, as recommended by your health care provider. Do any exercises as told by your physical therapist. Lifestyle  Maintain a healthy weight. Extra weight puts stress on your back and makes it difficult to have good posture.  Avoid activities or situations that make you feel anxious or stressed. Stress and anxiety increase muscle   tension and can make back pain worse. Learn ways to manage anxiety and stress, such as through exercise. General instructions  Sleep on a firm mattress in a comfortable position. Try lying on your side with your knees slightly bent. If you lie on your back, put a pillow under your knees.  Follow your treatment plan as told by your health care provider. This may include: ? Cognitive or behavioral therapy. ? Acupuncture or massage therapy. ? Meditation or yoga. Contact a health care provider if:  You have pain that is not relieved with rest or medicine.  You have increasing pain going down  into your legs or buttocks.  Your pain does not improve after 2 weeks.  You have pain at night.  You lose weight without trying.  You have a fever or chills. Get help right away if:  You develop new bowel or bladder control problems.  You have unusual weakness or numbness in your arms or legs.  You develop nausea or vomiting.  You develop abdominal pain.  You feel faint. Summary  Acute back pain is sudden and usually short-lived.  Use proper lifting techniques. When you bend and lift, use positions that put less stress on your back.  Take over-the-counter and prescription medicines and apply heat or ice as directed by your health care provider. This information is not intended to replace advice given to you by your health care provider. Make sure you discuss any questions you have with your health care provider. Document Revised: 03/29/2019 Document Reviewed: 07/22/2017 Elsevier Patient Education  2020 [employer]  

## 2020-12-04 ENCOUNTER — Other Ambulatory Visit: Payer: Self-pay | Admitting: Family

## 2020-12-04 DIAGNOSIS — I10 Essential (primary) hypertension: Secondary | ICD-10-CM

## 2020-12-11 ENCOUNTER — Other Ambulatory Visit: Payer: Self-pay | Admitting: Family

## 2020-12-11 DIAGNOSIS — E785 Hyperlipidemia, unspecified: Secondary | ICD-10-CM

## 2020-12-11 DIAGNOSIS — I10 Essential (primary) hypertension: Secondary | ICD-10-CM

## 2020-12-27 ENCOUNTER — Encounter: Payer: 59 | Admitting: Family

## 2020-12-30 ENCOUNTER — Other Ambulatory Visit: Payer: Self-pay | Admitting: Family

## 2020-12-30 DIAGNOSIS — I10 Essential (primary) hypertension: Secondary | ICD-10-CM

## 2021-01-03 ENCOUNTER — Telehealth: Payer: Self-pay

## 2021-01-03 DIAGNOSIS — E785 Hyperlipidemia, unspecified: Secondary | ICD-10-CM

## 2021-01-03 MED ORDER — ATORVASTATIN CALCIUM 40 MG PO TABS: 40.0000 mg | ORAL_TABLET | Freq: Every day | ORAL | 0 refills | Status: DC

## 2021-01-03 NOTE — Telephone Encounter (Signed)
  Prescription Request  01/03/2021  What is the name of the medication or equipment? atorvastatin (LIPITOR) 40 MG tablet  Have you contacted your pharmacy to request a refill? (if applicable) no pt has at 01/30/2021 w [facility]  Which pharmacy would you like this sent to? [facility]   Patient notified that their request is being sent to the clinical staff for review and that they should receive a response within 2 business days.

## 2021-01-03 NOTE — Telephone Encounter (Signed)
Rx has been sent- left detailed message for patient.

## 2021-01-12 ENCOUNTER — Other Ambulatory Visit: Payer: Self-pay

## 2021-01-12 ENCOUNTER — Encounter (HOSPITAL_COMMUNITY): Payer: Self-pay

## 2021-01-12 ENCOUNTER — Emergency Department (HOSPITAL_COMMUNITY)
Admission: EM | Admit: 2021-01-12 | Discharge: 2021-01-12 | Disposition: A | Discharge status: Home / Self Care | Payer: 59 | Attending: Emergency Medicine | Admitting: Emergency Medicine

## 2021-01-12 DIAGNOSIS — J029 Acute pharyngitis, unspecified: Secondary | ICD-10-CM | POA: Insufficient documentation

## 2021-01-12 DIAGNOSIS — U071 COVID-19: Secondary | ICD-10-CM | POA: Diagnosis not present

## 2021-01-12 DIAGNOSIS — Z20822 Contact with and (suspected) exposure to covid-19: Secondary | ICD-10-CM

## 2021-01-12 DIAGNOSIS — R059 Cough, unspecified: Secondary | ICD-10-CM | POA: Diagnosis present

## 2021-01-12 DIAGNOSIS — I10 Essential (primary) hypertension: Secondary | ICD-10-CM | POA: Insufficient documentation

## 2021-01-12 DIAGNOSIS — Z79899 Other long term (current) drug therapy: Secondary | ICD-10-CM | POA: Insufficient documentation

## 2021-01-12 LAB — SARS CORONAVIRUS 2 (TAT 6-24 HRS): SARS Coronavirus 2: POSITIVE — AB

## 2021-01-12 LAB — GROUP A STREP BY PCR: Group A Strep by PCR: NOT DETECTED

## 2021-01-12 MED ORDER — LIDOCAINE VISCOUS HCL 2 % MT SOLN
15.0000 mL | Freq: Once | OROMUCOSAL | Status: AC
  Administered 2021-01-12: 15 mL via OROMUCOSAL
  Filled 2021-01-12: qty 15

## 2021-01-12 MED ORDER — LIDOCAINE VISCOUS HCL 2 % MT SOLN: 15.0000 mL | Freq: Four times a day (QID) | OROMUCOSAL | 0 refills | Status: DC | PRN

## 2021-01-12 NOTE — Discharge Instructions (Signed)
If you develop high fever, severe cough or cough with blood, trouble breathing, severe headache, neck pain/stiffness, vomiting, or any other new/concerning symptoms then return to the ER for evaluation  

## 2021-01-12 NOTE — ED Provider Notes (Signed)
[facility] EMERGENCY DEPARTMENT Provider Note   CSN: [id number] Arrival date & time: 01/12/21  3235     History Chief Complaint  Patient presents with  . Sore Throat    Kevin Russo is a 50 y.o. male.  HPI 50 year old male presents with sore throat.  Started 2 days ago.  The throat pain is keeping him up at night and making it hard to drink and eat.  Feels very sharp.  Some cough.  Had a fever on the first day of 101 but since has had low-grade 99 temps.  No further chills.  He transiently had some abdominal/flank pain and thought he might have a recurrent kidney stone but this pain has resolved and not come back.  No shortness of breath.  Has been vaccinated versus COVID, no booster.   Past Medical History:  Diagnosis Date  . Elevated liver enzymes   . Hyperlipemia   . Hypertension     Patient Active Problem List   Diagnosis Date Noted  . Hepatic steatosis 11/10/2017  . Obesity (BMI 30-39.9) 11/03/2016  . Hyperlipidemia 10/29/2015  . Gastroesophageal reflux disease with esophagitis 03/22/2015  . Hypertension 12/28/2013    Past Surgical History:  Procedure Laterality Date  . NO PAST SURGERIES         Family History  Problem Relation Age of Onset  . Healthy Mother   . Healthy Father   . Colon polyps Neg Hx   . Crohn's disease Neg Hx     Social History   Tobacco Use  . Smoking status: Never Smoker  . Smokeless tobacco: Never Used  Vaping Use  . Vaping Use: Never used  Substance Use Topics  . Alcohol use: Yes    Comment: Occasionally  . Drug use: No    Home Medications Prior to Admission medications   Medication Sig Start Date End Date Taking? Authorizing Provider  lidocaine (XYLOCAINE) 2 % solution Use as directed 15 mLs in the mouth or throat every 6 (six) hours as needed for mouth pain. 01/12/21  Yes Pricilla Loveless, MD  amLODipine (NORVASC) 10 MG tablet TAKE 1 TABLET BY MOUTH EVERY DAY 12/04/20   Jannifer Talon A, FNP  atorvastatin (LIPITOR) 40  MG tablet Take 1 tablet (40 mg total) by mouth daily. 01/03/21   Junie Spencer, FNP  baclofen (LIORESAL) 10 MG tablet Take 1 tablet (10 mg total) by mouth 3 (three) times daily. 09/18/20   Junie Spencer, FNP  diclofenac (VOLTAREN) 75 MG EC tablet Take 1 tablet (75 mg total) by mouth 2 (two) times daily. 09/18/20   Jannifer Omarri A, FNP  lisinopril-hydrochlorothiazide (ZESTORETIC) 20-12.5 MG tablet Take 1 tablet by mouth daily. 12/11/20   Junie Spencer, FNP  predniSONE (STERAPRED UNI-PAK 21 TAB) 10 MG (21) TBPK tablet Use as directed 09/18/20   Junie Spencer, FNP    Allergies    C[place]  Review of Systems   Review of Systems  Constitutional: Positive for fever.  HENT: Positive for sore throat.   Respiratory: Positive for cough. Negative for shortness of breath.   Gastrointestinal: Negative for abdominal pain.    Physical Exam Updated Vital Signs BP 105/73 (BP Location: Left Arm)   Pulse 94   Temp 97.9 F (36.6 C) (Oral)   Resp 18   Ht 5\' 10"  (1.778 m)   Wt 104.3 kg   SpO2 97%   BMI 33.00 kg/m   Physical Exam Vitals and nursing note reviewed.  Constitutional:  General: He is not in acute distress.    Appearance: He is well-developed and well-nourished. He is not ill-appearing or diaphoretic.  HENT:     Head: Normocephalic and atraumatic.     Right Ear: External ear normal.     Left Ear: External ear normal.     Nose: Nose normal.     Mouth/Throat:     Pharynx: Uvula midline. Posterior oropharyngeal erythema present. No oropharyngeal exudate or uvula swelling.     Tonsils: No tonsillar abscesses.  Eyes:     General:        Right eye: No discharge.        Left eye: No discharge.  Cardiovascular:     Rate and Rhythm: Normal rate and regular rhythm.     Heart sounds: Normal heart sounds.  Pulmonary:     Effort: Pulmonary effort is normal.     Breath sounds: Normal breath sounds.  Abdominal:     Palpations: Abdomen is soft.     Tenderness: There is no  abdominal tenderness.  Musculoskeletal:        General: No edema.     Cervical back: Normal range of motion and neck supple.  Skin:    General: Skin is warm and dry.  Neurological:     Mental Status: He is alert.  Psychiatric:        Mood and Affect: Mood is not anxious.     ED Results / Procedures / Treatments   Labs (all labs ordered are listed, but only abnormal results are displayed) Labs Reviewed  GROUP A STREP BY PCR  SARS CORONAVIRUS 2 (TAT 6-24 HRS)    EKG None  Radiology No results found.  Procedures Procedures (including critical care time)  Medications Ordered in ED Medications  lidocaine (XYLOCAINE) 2 % viscous mouth solution 15 mL (has no administration in time range)    ED Course  I have reviewed the triage vital signs and the nursing notes.  Pertinent labs & imaging results that were available during my care of the patient were reviewed by me and considered in my medical decision making (see chart for details).    MDM Rules/Calculators/A&P                          Patient is well-appearing.  Strep test is negative.  Appears to have a viral pharyngitis.  Given the current pandemic I also suspect COVID-19.  However he has no increased work of breathing, hypoxia or abnormal lung sounds.  Doubt pneumonia.  Will send for COVID testing and treat for sore throat with continued ibuprofen, Tylenol and add on viscous lidocaine.  At this point there is no indication of a more deep space infection such as retropharyngeal abscess, epiglottitis, tonsillar abscess, etc.  HENSLEY TREAT was evaluated in Emergency Department on 01/12/2021 for the symptoms described in the history of present illness. He was evaluated in the context of the global COVID-19 pandemic, which necessitated consideration that the patient might be at risk for infection with the SARS-CoV-2 virus that causes COVID-19. Institutional protocols and algorithms that pertain to the evaluation of patients at  risk for COVID-19 are in a state of rapid change based on information released by regulatory bodies including the CDC and federal and state organizations. These policies and algorithms were followed during the patient's care in the ED.  Final Clinical Impression(s) / ED Diagnoses Final diagnoses:  Viral pharyngitis  Suspected COVID-19 virus infection  Rx / DC Orders ED Discharge Orders         Ordered    lidocaine (XYLOCAINE) 2 % solution  Every 6 hours PRN        01/12/21 0844           Pricilla Loveless, MD 01/12/21 [phone]

## 2021-01-12 NOTE — ED Triage Notes (Signed)
Pt presents to ED with complaints of pain in his throat, fever up to 101, chills since Thursday. Pt states on Thursday he had left flank pain but since has resolved.

## 2021-01-30 ENCOUNTER — Ambulatory Visit (INDEPENDENT_AMBULATORY_CARE_PROVIDER_SITE_OTHER): Payer: 59 | Admitting: Family

## 2021-01-30 ENCOUNTER — Encounter: Payer: Self-pay | Admitting: Family

## 2021-01-30 VITALS — BP 109/67 | HR 79 | Temp 97.7°F | Ht 70.0 in | Wt 240.2 lb

## 2021-01-30 DIAGNOSIS — K760 Fatty (change of) liver, not elsewhere classified: Secondary | ICD-10-CM

## 2021-01-30 DIAGNOSIS — K2100 Gastro-esophageal reflux disease with esophagitis, without bleeding: Secondary | ICD-10-CM | POA: Diagnosis not present

## 2021-01-30 DIAGNOSIS — E669 Obesity, unspecified: Secondary | ICD-10-CM

## 2021-01-30 DIAGNOSIS — I10 Essential (primary) hypertension: Secondary | ICD-10-CM | POA: Diagnosis not present

## 2021-01-30 DIAGNOSIS — Z0001 Encounter for general adult medical examination with abnormal findings: Secondary | ICD-10-CM | POA: Diagnosis not present

## 2021-01-30 DIAGNOSIS — E785 Hyperlipidemia, unspecified: Secondary | ICD-10-CM

## 2021-01-30 DIAGNOSIS — Z1211 Encounter for screening for malignant neoplasm of colon: Secondary | ICD-10-CM

## 2021-01-30 DIAGNOSIS — Z0000 Encounter for general adult medical examination without abnormal findings: Secondary | ICD-10-CM

## 2021-01-30 MED ORDER — LISINOPRIL-HYDROCHLOROTHIAZIDE 20-12.5 MG PO TABS: 1.0000 | ORAL_TABLET | Freq: Every day | ORAL | 2 refills | Status: DC

## 2021-01-30 MED ORDER — AMLODIPINE BESYLATE 10 MG PO TABS: 10.0000 mg | ORAL_TABLET | Freq: Every day | ORAL | 2 refills | Status: DC

## 2021-01-30 MED ORDER — ATORVASTATIN CALCIUM 40 MG PO TABS: 40.0000 mg | ORAL_TABLET | Freq: Every day | ORAL | 2 refills | Status: DC

## 2021-01-30 NOTE — Progress Notes (Signed)
Subjective:    Patient ID: Kevin Russo, male    DOB: 09-15-1971, 50 y.o.   MRN: [id number]  Chief Complaint  Patient presents with  . Annual Exam   Pt presents to the office today for CPE.  Hypertension This is a chronic problem. The current episode started more than 1 year ago. The problem has been resolved since onset. The problem is controlled. Pertinent negatives include no malaise/fatigue, peripheral edema or shortness of breath. Risk factors for coronary artery disease include dyslipidemia, obesity and male gender. The current treatment provides moderate improvement.  Gastroesophageal Reflux He complains of belching and heartburn. This is a chronic problem. The current episode started more than 1 year ago. The problem has been waxing and waning. He has tried an antacid for the symptoms. The treatment provided moderate relief.  Hyperlipidemia This is a chronic problem. The current episode started more than 1 year ago. The problem is uncontrolled. Exacerbating diseases include obesity. Pertinent negatives include no shortness of breath. Risk factors for coronary artery disease include dyslipidemia, male sex, hypertension and a sedentary lifestyle.      Review of Systems  Constitutional: Negative for malaise/fatigue.  Respiratory: Negative for shortness of breath.   Gastrointestinal: Positive for heartburn.  All other systems reviewed and are negative.  Family History  Problem Relation Age of Onset  . Healthy Mother   . Healthy Father   . Colon polyps Neg Hx   . Crohn's disease Neg Hx    Social History   Socioeconomic History  . Marital status: Married    Spouse name: Not on file  . Number of children: Not on file  . Years of education: Not on file  . Highest education level: Not on file  Occupational History  . Not on file  Tobacco Use  . Smoking status: Never Smoker  . Smokeless tobacco: Never Used  Vaping Use  . Vaping Use: Never used  Substance and Sexual  Activity  . Alcohol use: Yes    Comment: Occasionally  . Drug use: No  . Sexual activity: Not on file  Other Topics Concern  . Not on file  Social History Narrative  . Not on file   Social Determinants of Health   Financial Resource Strain: Not on file  Food Insecurity: Not on file  Transportation Needs: Not on file  Physical Activity: Not on file  Stress: Not on file  Social Connections: Not on file  Intimate Partner Violence: Not on file       Objective:   Physical Exam Vitals reviewed.  Constitutional:      General: He is not in acute distress.    Appearance: He is well-developed and well-nourished.  HENT:     Head: Normocephalic.     Right Ear: Tympanic membrane normal.     Left Ear: Tympanic membrane normal.     Mouth/Throat:     Mouth: Oropharynx is clear and moist.  Eyes:     General:        Right eye: No discharge.        Left eye: No discharge.     Pupils: Pupils are equal, round, and reactive to light.  Neck:     Thyroid: No thyromegaly.  Cardiovascular:     Rate and Rhythm: Normal rate and regular rhythm.     Pulses: Intact distal pulses.     Heart sounds: Normal heart sounds. No murmur heard.   Pulmonary:     Effort: Pulmonary effort  is normal. No respiratory distress.     Breath sounds: Normal breath sounds. No wheezing.  Abdominal:     General: Bowel sounds are normal. There is no distension.     Palpations: Abdomen is soft.     Tenderness: There is no abdominal tenderness.  Musculoskeletal:        General: No tenderness or edema. Normal range of motion.     Cervical back: Normal range of motion and neck supple.  Skin:    General: Skin is warm and dry.     Findings: No erythema or rash.  Neurological:     Mental Status: He is alert and oriented to person, place, and time.     Cranial Nerves: No cranial nerve deficit.     Deep Tendon Reflexes: Reflexes are normal and symmetric.  Psychiatric:        Mood and Affect: Mood and affect normal.         Behavior: Behavior normal.        Thought Content: Thought content normal.        Judgment: Judgment normal.       BP 90/61   Pulse 74   Temp 97.7 F (36.5 C) (Temporal)   Ht '5\' 10"'  (1.778 m)   Wt 240 lb 3.2 oz (109 kg)   SpO2 99%   BMI 34.47 kg/m      Assessment & Plan:  Kevin Russo comes in today with chief complaint of Annual Exam   Diagnosis and orders addressed:  1. Primary hypertension - CMP14+EGFR - CBC with Differential/Platelet - lisinopril-hydrochlorothiazide (ZESTORETIC) 20-12.5 MG tablet; Take 1 tablet by mouth daily.  Dispense: 90 tablet; Refill: 2 - amLODipine (NORVASC) 10 MG tablet; Take 1 tablet (10 mg total) by mouth daily.  Dispense: 90 tablet; Refill: 2  2. Hepatic steatosis - CMP14+EGFR - CBC with Differential/Platelet  3. Gastroesophageal reflux disease with esophagitis, unspecified whether hemorrhage - CMP14+EGFR - CBC with Differential/Platelet  4. Obesity (BMI 30-39.9) - CMP14+EGFR - CBC with Differential/Platelet  5. Hyperlipidemia, unspecified hyperlipidemia type - CMP14+EGFR - CBC with Differential/Platelet - Lipid panel - atorvastatin (LIPITOR) 40 MG tablet; Take 1 tablet (40 mg total) by mouth daily.  Dispense: 90 tablet; Refill: 2  6. Annual physical exam - CMP14+EGFR - CBC with Differential/Platelet - Lipid panel - TSH - PSA, total and free  7. Colon cancer screening - Ambulatory referral to Gastroenterology   Labs pending Health Maintenance reviewed Diet and exercise encouraged  Follow up plan: 6 months    Evelina Dun, FNP

## 2021-01-30 NOTE — Patient Instructions (Signed)

## 2021-01-31 LAB — CMP14+EGFR
ALT: 99 IU/L — ABNORMAL HIGH (ref 0–44)
AST: 36 IU/L (ref 0–40)
Albumin/Globulin Ratio: 1.7 (ref 1.2–2.2)
Albumin: 5 g/dL (ref 4.0–5.0)
Alkaline Phosphatase: 129 IU/L — ABNORMAL HIGH (ref 44–121)
BUN/Creatinine Ratio: 14 (ref 9–20)
BUN: 13 mg/dL (ref 6–24)
Bilirubin Total: 0.5 mg/dL (ref 0.0–1.2)
CO2: 22 mmol/L (ref 20–29)
Calcium: 9.8 mg/dL (ref 8.7–10.2)
Chloride: 99 mmol/L (ref 96–106)
Creatinine, Ser: 0.96 mg/dL (ref 0.76–1.27)
GFR calc Af Amer: 108 mL/min/{1.73_m2} (ref >59)
GFR calc non Af Amer: 93 mL/min/{1.73_m2} (ref >59)
Globulin, Total: 2.9 g/dL (ref 1.5–4.5)
Glucose: 86 mg/dL (ref 65–99)
Potassium: 4 mmol/L (ref 3.5–5.2)
Sodium: 138 mmol/L (ref 134–144)
Total Protein: 7.9 g/dL (ref 6.0–8.5)

## 2021-01-31 LAB — CBC WITH DIFFERENTIAL/PLATELET
Basophils Absolute: 0.1 10*3/uL (ref 0.0–0.2)
Basos: 1 %
EOS (ABSOLUTE): 0.1 10*3/uL (ref 0.0–0.4)
Eos: 1 %
Hematocrit: 45.7 % (ref 37.5–51.0)
Hemoglobin: 15.5 g/dL (ref 13.0–17.7)
Immature Grans (Abs): 0 10*3/uL (ref 0.0–0.1)
Immature Granulocytes: 0 %
Lymphocytes Absolute: 1.7 10*3/uL (ref 0.7–3.1)
Lymphs: 22 %
MCH: 28.9 pg (ref 26.6–33.0)
MCHC: 33.9 g/dL (ref 31.5–35.7)
MCV: 85 fL (ref 79–97)
Monocytes Absolute: 0.8 10*3/uL (ref 0.1–0.9)
Monocytes: 10 %
Neutrophils Absolute: 5.2 10*3/uL (ref 1.4–7.0)
Neutrophils: 66 %
Platelets: 435 10*3/uL (ref 150–450)
RBC: 5.37 x10E6/uL (ref 4.14–5.80)
RDW: 12.4 % (ref 11.6–15.4)
WBC: 7.9 10*3/uL (ref 3.4–10.8)

## 2021-01-31 LAB — PSA, TOTAL AND FREE
PSA, Free Pct: 50 %
PSA, Free: 0.3 ng/mL
Prostate Specific Ag, Serum: 0.6 ng/mL (ref 0.0–4.0)

## 2021-01-31 LAB — LIPID PANEL
Chol/HDL Ratio: 3 ratio (ref 0.0–5.0)
Cholesterol, Total: 134 mg/dL (ref 100–199)
HDL: 44 mg/dL (ref >39)
LDL Chol Calc (NIH): 72 mg/dL (ref 0–99)
Triglycerides: 98 mg/dL (ref 0–149)
VLDL Cholesterol Cal: 18 mg/dL (ref 5–40)

## 2021-01-31 LAB — TSH: TSH: 0.969 u[IU]/mL (ref 0.450–4.500)

## 2021-03-19 ENCOUNTER — Other Ambulatory Visit: Payer: Self-pay | Admitting: Physician Assistant

## 2021-03-19 DIAGNOSIS — R7989 Other specified abnormal findings of blood chemistry: Secondary | ICD-10-CM

## 2021-04-27 ENCOUNTER — Other Ambulatory Visit: Payer: Self-pay | Admitting: Family

## 2021-04-27 DIAGNOSIS — M545 Low back pain, unspecified: Secondary | ICD-10-CM

## 2021-07-26 ENCOUNTER — Ambulatory Visit: Payer: 59 | Admitting: Family

## 2021-07-30 ENCOUNTER — Encounter: Payer: Self-pay | Admitting: Family

## 2022-01-22 ENCOUNTER — Other Ambulatory Visit: Payer: Self-pay | Admitting: Family

## 2022-01-22 DIAGNOSIS — E785 Hyperlipidemia, unspecified: Secondary | ICD-10-CM

## 2022-02-04 ENCOUNTER — Encounter: Payer: 59 | Admitting: Family

## 2022-02-11 ENCOUNTER — Encounter: Payer: Self-pay | Admitting: Family

## 2022-02-11 ENCOUNTER — Ambulatory Visit (INDEPENDENT_AMBULATORY_CARE_PROVIDER_SITE_OTHER): Payer: 59 | Admitting: Family

## 2022-02-11 VITALS — BP 102/64 | HR 75 | Temp 98.3°F | Ht 70.0 in | Wt 249.8 lb

## 2022-02-11 DIAGNOSIS — E785 Hyperlipidemia, unspecified: Secondary | ICD-10-CM | POA: Diagnosis not present

## 2022-02-11 DIAGNOSIS — Z0001 Encounter for general adult medical examination with abnormal findings: Secondary | ICD-10-CM | POA: Diagnosis not present

## 2022-02-11 DIAGNOSIS — Z1211 Encounter for screening for malignant neoplasm of colon: Secondary | ICD-10-CM

## 2022-02-11 DIAGNOSIS — K760 Fatty (change of) liver, not elsewhere classified: Secondary | ICD-10-CM

## 2022-02-11 DIAGNOSIS — K2100 Gastro-esophageal reflux disease with esophagitis, without bleeding: Secondary | ICD-10-CM

## 2022-02-11 DIAGNOSIS — I10 Essential (primary) hypertension: Secondary | ICD-10-CM | POA: Diagnosis not present

## 2022-02-11 DIAGNOSIS — Z0000 Encounter for general adult medical examination without abnormal findings: Secondary | ICD-10-CM

## 2022-02-11 MED ORDER — ATORVASTATIN CALCIUM 40 MG PO TABS: 40.0000 mg | ORAL_TABLET | Freq: Every day | ORAL | 2 refills | Status: DC

## 2022-02-11 MED ORDER — LISINOPRIL-HYDROCHLOROTHIAZIDE 20-12.5 MG PO TABS: 1.0000 | ORAL_TABLET | Freq: Every day | ORAL | 2 refills | Status: DC

## 2022-02-11 MED ORDER — AMLODIPINE BESYLATE 10 MG PO TABS: 10.0000 mg | ORAL_TABLET | Freq: Every day | ORAL | 2 refills | Status: DC

## 2022-02-11 NOTE — Progress Notes (Signed)
Subjective:    Patient ID: Kevin Russo, male    DOB: August 19, 1971, 51 y.o.   MRN: [id number]  Chief Complaint  Patient presents with   Annual Exam    Feet swelling at the end of the day after work.   Pt presents to the office today for CPE. He has hepatic steatosis and has had [place] in the past. He occasionally drinks twice a month. However, does admit to eating whatever he wants.   He has morbid obese with a BMI of 35 and HTN and hyperlipidemia.  Hypertension This is a chronic problem. The current episode started more than 1 year ago. The problem has been resolved since onset. The problem is controlled. Associated symptoms include peripheral edema (at the end of the day). Pertinent negatives include no malaise/fatigue or shortness of breath. Risk factors for coronary artery disease include dyslipidemia, obesity and male gender. The current treatment provides moderate improvement.  Gastroesophageal Reflux He reports no belching or no heartburn. This is a chronic problem. The current episode started more than 1 year ago. The problem occurs occasionally. Risk factors include obesity. He has tried a PPI for the symptoms. The treatment provided moderate relief.  Hyperlipidemia This is a chronic problem. The current episode started more than 1 year ago. Exacerbating diseases include obesity. Pertinent negatives include no shortness of breath. Current antihyperlipidemic treatment includes statins. The current treatment provides moderate improvement of lipids. Risk factors for coronary artery disease include dyslipidemia, male sex, hypertension and a sedentary lifestyle.     Review of Systems  Constitutional:  Negative for malaise/fatigue.  Respiratory:  Negative for shortness of breath.   Gastrointestinal:  Negative for heartburn.  All other systems reviewed and are negative.     Objective:   Physical Exam Vitals reviewed.  Constitutional:      General: He is not in acute distress.     Appearance: He is well-developed.  HENT:     Head: Normocephalic.     Right Ear: Tympanic membrane normal.     Left Ear: Tympanic membrane normal.  Eyes:     General:        Right eye: No discharge.        Left eye: No discharge.     Pupils: Pupils are equal, round, and reactive to light.  Neck:     Thyroid: No thyromegaly.  Cardiovascular:     Rate and Rhythm: Normal rate and regular rhythm.     Heart sounds: Normal heart sounds. No murmur heard. Pulmonary:     Effort: Pulmonary effort is normal. No respiratory distress.     Breath sounds: Normal breath sounds. No wheezing.  Abdominal:     General: Bowel sounds are normal. There is no distension.     Palpations: Abdomen is soft.     Tenderness: There is no abdominal tenderness.  Musculoskeletal:        General: No tenderness. Normal range of motion.     Cervical back: Normal range of motion and neck supple.  Skin:    General: Skin is warm and dry.     Findings: No erythema or rash.  Neurological:     Mental Status: He is alert and oriented to person, place, and time.     Cranial Nerves: No cranial nerve deficit.     Deep Tendon Reflexes: Reflexes are normal and symmetric.  Psychiatric:        Behavior: Behavior normal.        Thought Content:  Thought content normal.        Judgment: Judgment normal.      BP 102/64    Pulse 75    Temp 98.3 F (36.8 C) (Temporal)    Ht '5\' 10"'  (1.778 m)    Wt 249 lb 12.8 oz (113.3 kg)    BMI 35.84 kg/m      Assessment & Plan:  ELVER STADLER comes in today with chief complaint of Annual Exam (Feet swelling at the end of the day after work.)   Diagnosis and orders addressed:  1. Hyperlipidemia, unspecified hyperlipidemia type - atorvastatin (LIPITOR) 40 MG tablet; Take 1 tablet (40 mg total) by mouth daily.  Dispense: 90 tablet; Refill: 2 - CBC with Differential/Platelet - BMP8+EGFR - Lipid panel  2. Primary hypertension - lisinopril-hydrochlorothiazide (ZESTORETIC) 20-12.5 MG  tablet; Take 1 tablet by mouth daily.  Dispense: 90 tablet; Refill: 2 - amLODipine (NORVASC) 10 MG tablet; Take 1 tablet (10 mg total) by mouth daily.  Dispense: 90 tablet; Refill: 2 - CBC with Differential/Platelet - BMP8+EGFR  3. Annual physical exam - Ambulatory referral to Gastroenterology - CBC with Differential/Platelet - BMP8+EGFR - Hepatic function panel - PSA, total and free - TSH - Lipid panel  4. Gastroesophageal reflux disease with esophagitis, unspecified whether hemorrhage - CBC with Differential/Platelet - BMP8+EGFR  5. Morbid obesity ([place]) - CBC with Differential/Platelet - BMP8+EGFR  6. Hepatic steatosis - CBC with Differential/Platelet - BMP8+EGFR - PSA, total and free  7. Colon cancer screening - Ambulatory referral to Gastroenterology - CBC with Differential/Platelet - BMP8+EGFR   Labs pending Health Maintenance reviewed Diet and exercise encouraged  Follow up plan: 6 months    Evelina Dun, FNP

## 2022-02-11 NOTE — Patient Instructions (Signed)
Peripheral Edema °Peripheral edema is swelling that is caused by a buildup of fluid. Peripheral edema most often affects the lower legs, ankles, and feet. It can also develop in the arms, hands, and face. The area of the body that has peripheral edema will look swollen. It may also feel heavy or warm. Your clothes may start to feel tight. Pressing on the area may make a temporary dent in your skin. You may not be able to move your swollen arm or leg as much as usual. °There are many causes of peripheral edema. It can happen because of a complication of other conditions such as congestive heart failure, kidney disease, or a problem with your blood circulation. It also can be a side effect of certain medicines or because of an infection. It often happens to women during pregnancy. Sometimes, the cause is not known. °Follow these instructions at home: °Managing pain, stiffness, and swelling ° °Raise (elevate) your legs while you are sitting or lying down. °Move around often to prevent stiffness and to lessen swelling. °Do not sit or stand for long periods of time. °Wear support stockings as told by your health care provider. °Medicines °Take over-the-counter and prescription medicines only as told by your health care provider. °Your health care provider may prescribe medicine to help your body get rid of excess water (diuretic). °General instructions °Pay attention to any changes in your symptoms. °Follow instructions from your health care provider about limiting salt (sodium) in your diet. Sometimes, eating less salt may reduce swelling. °Moisturize skin daily to help prevent skin from cracking and draining. °Keep all follow-up visits as told by your health care provider. This is important. °Contact a health care provider if you have: °A fever. °Edema that starts suddenly or is getting worse, especially if you are pregnant or have a medical condition. °Swelling in only one leg. °Increased swelling, redness, or pain in  one or both of your legs. °Drainage or sores at the area where you have edema. °Get help right away if you: °Develop shortness of breath, especially when you are lying down. °Have pain in your chest or abdomen. °Feel weak. °Feel faint. °Summary °Peripheral edema is swelling that is caused by a buildup of fluid. Peripheral edema most often affects the lower legs, ankles, and feet. °Move around often to prevent stiffness and to lessen swelling. Do not sit or stand for long periods of time. °Pay attention to any changes in your symptoms. °Contact a health care provider if you have edema that starts suddenly or is getting worse, especially if you are pregnant or have a medical condition. °Get help right away if you develop shortness of breath, especially when lying down. °This information is not intended to replace advice given to you by your health care provider. Make sure you discuss any questions you have with your health care provider. °Document Revised: 05/09/2021 Document Reviewed: 09/01/2018 °Elsevier Patient Education © 2022 [employer] ° °

## 2022-02-12 LAB — CBC WITH DIFFERENTIAL/PLATELET
Basophils Absolute: 0.1 10*3/uL (ref 0.0–0.2)
Basos: 1 %
EOS (ABSOLUTE): 0.1 10*3/uL (ref 0.0–0.4)
Eos: 2 %
Hematocrit: 45.9 % (ref 37.5–51.0)
Hemoglobin: 15.5 g/dL (ref 13.0–17.7)
Immature Grans (Abs): 0 10*3/uL (ref 0.0–0.1)
Immature Granulocytes: 0 %
Lymphocytes Absolute: 2.1 10*3/uL (ref 0.7–3.1)
Lymphs: 29 %
MCH: 29.2 pg (ref 26.6–33.0)
MCHC: 33.8 g/dL (ref 31.5–35.7)
MCV: 87 fL (ref 79–97)
Monocytes Absolute: 0.8 10*3/uL (ref 0.1–0.9)
Monocytes: 11 %
Neutrophils Absolute: 4 10*3/uL (ref 1.4–7.0)
Neutrophils: 57 %
Platelets: 323 10*3/uL (ref 150–450)
RBC: 5.3 x10E6/uL (ref 4.14–5.80)
RDW: 13.1 % (ref 11.6–15.4)
WBC: 7.1 10*3/uL (ref 3.4–10.8)

## 2022-02-12 LAB — HEPATIC FUNCTION PANEL
ALT: 82 IU/L — ABNORMAL HIGH (ref 0–44)
AST: 42 IU/L — ABNORMAL HIGH (ref 0–40)
Albumin: 4.5 g/dL (ref 4.0–5.0)
Alkaline Phosphatase: 135 IU/L — ABNORMAL HIGH (ref 44–121)
Bilirubin Total: 0.5 mg/dL (ref 0.0–1.2)
Bilirubin, Direct: 0.11 mg/dL (ref 0.00–0.40)
Total Protein: 7.5 g/dL (ref 6.0–8.5)

## 2022-02-12 LAB — LIPID PANEL
Chol/HDL Ratio: 3.9 ratio (ref 0.0–5.0)
Cholesterol, Total: 154 mg/dL (ref 100–199)
HDL: 39 mg/dL — ABNORMAL LOW (ref >39)
LDL Chol Calc (NIH): 85 mg/dL (ref 0–99)
Triglycerides: 172 mg/dL — ABNORMAL HIGH (ref 0–149)
VLDL Cholesterol Cal: 30 mg/dL (ref 5–40)

## 2022-02-12 LAB — TSH: TSH: 1.26 u[IU]/mL (ref 0.450–4.500)

## 2022-02-12 LAB — BMP8+EGFR
BUN/Creatinine Ratio: 15 (ref 9–20)
BUN: 15 mg/dL (ref 6–24)
CO2: 23 mmol/L (ref 20–29)
Calcium: 9.8 mg/dL (ref 8.7–10.2)
Chloride: 100 mmol/L (ref 96–106)
Creatinine, Ser: 1.01 mg/dL (ref 0.76–1.27)
Glucose: 77 mg/dL (ref 70–99)
Potassium: 4 mmol/L (ref 3.5–5.2)
Sodium: 139 mmol/L (ref 134–144)
eGFR: 91 mL/min/{1.73_m2} (ref >59)

## 2022-02-12 LAB — PSA, TOTAL AND FREE
PSA, Free Pct: 58 %
PSA, Free: 0.29 ng/mL
Prostate Specific Ag, Serum: 0.5 ng/mL (ref 0.0–4.0)

## 2022-05-01 IMAGING — DX DG LUMBAR SPINE 2-3V
2 series · 2 of 2 positions shown · non-contrast
Comparison: None.

CLINICAL DATA: Low back pain

EXAM:
LUMBAR SPINE - 2-3 VIEW

[l-spine ap]
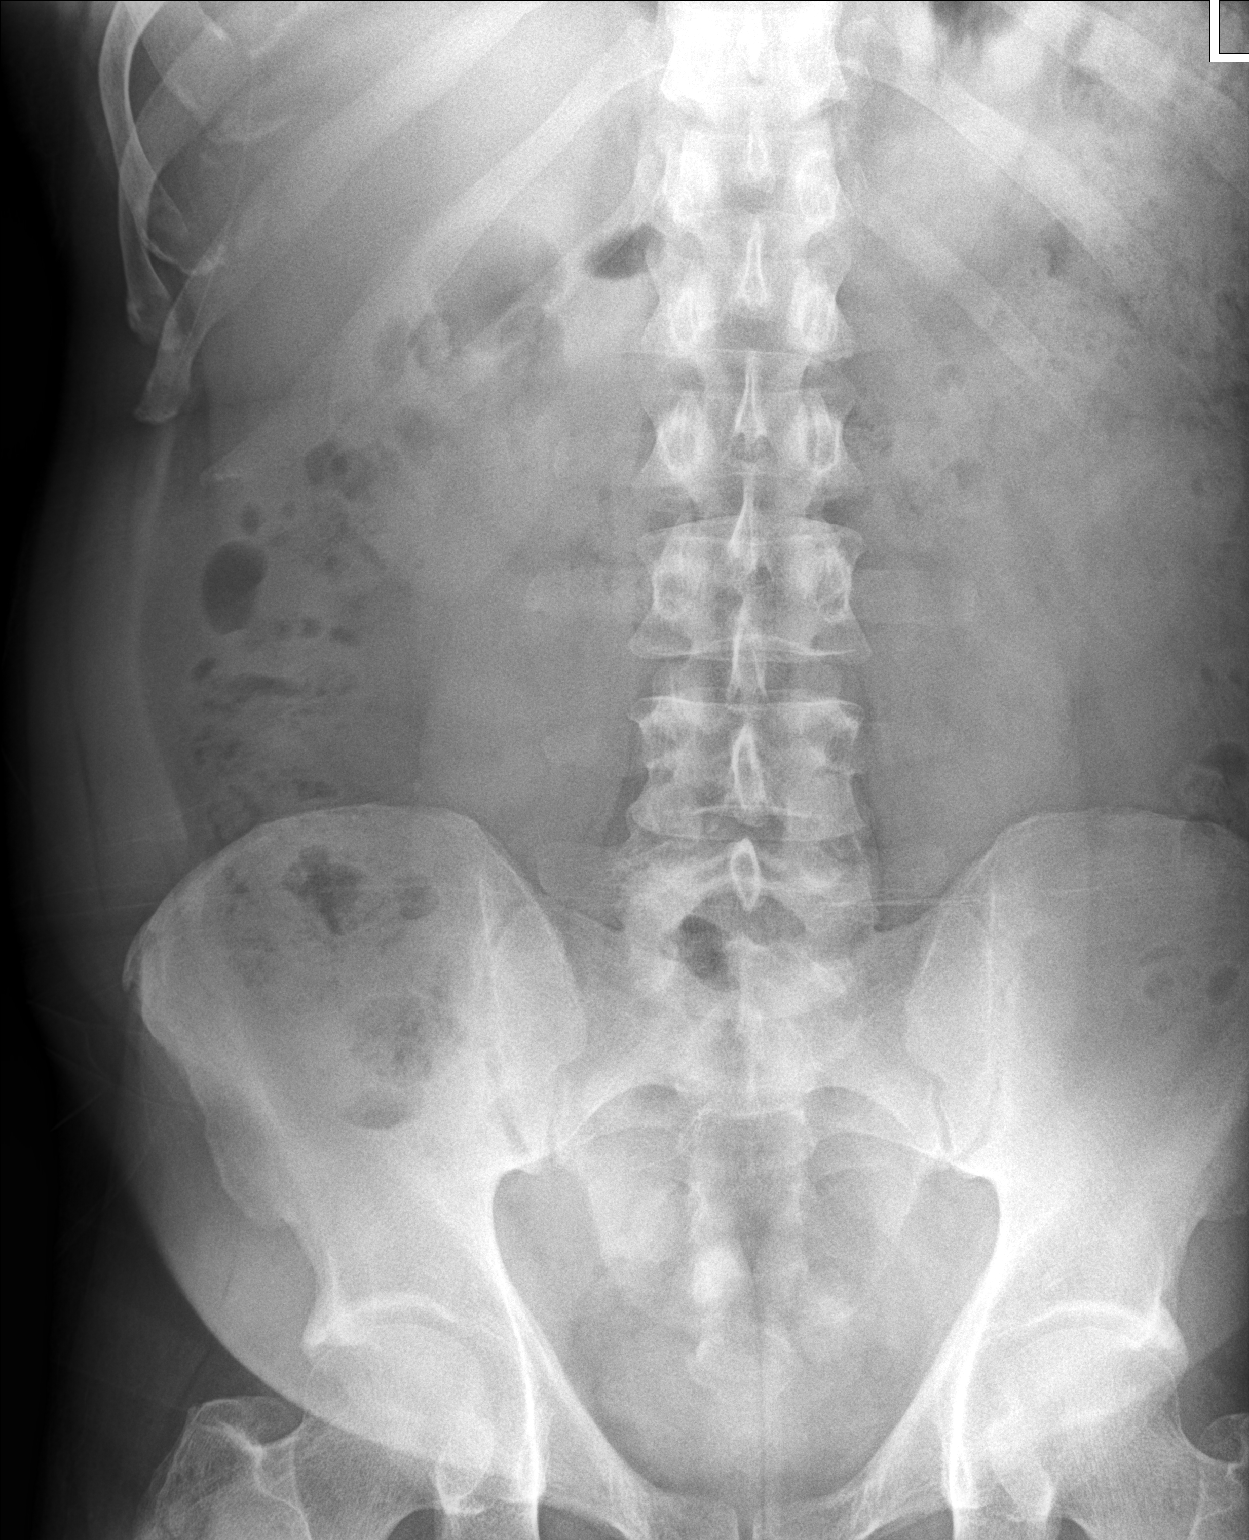

[l-spine lat]
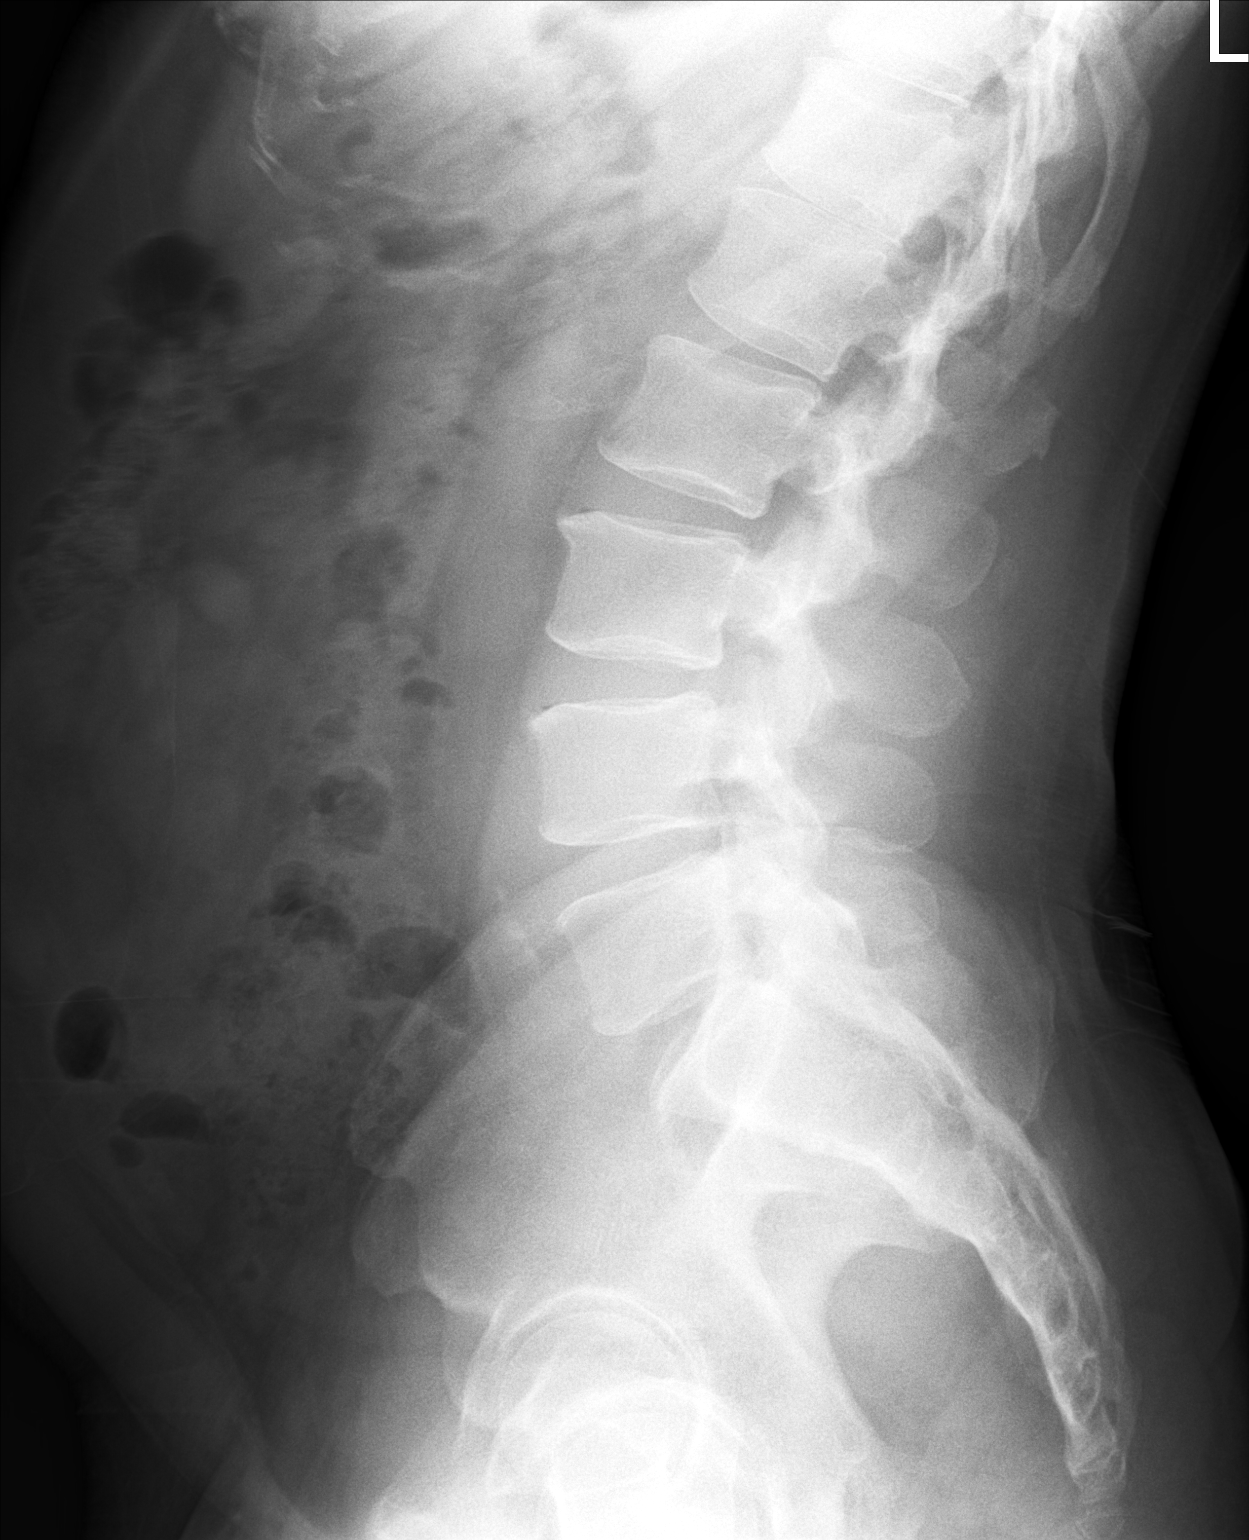

[2 of 2 positions shown; findings below may reference images not displayed]

FINDINGS: Frontal and lateral views were obtained. There are 5 non-rib-bearing
lumbar type vertebral bodies. There is no fracture or
spondylolisthesis. Disc spaces appear normal. No erosive change.
IMPRESSION: No fracture or spondylolisthesis.  No appreciable arthropathy.

## 2022-06-06 ENCOUNTER — Other Ambulatory Visit: Payer: Self-pay | Admitting: Family

## 2022-06-06 DIAGNOSIS — E785 Hyperlipidemia, unspecified: Secondary | ICD-10-CM

## 2022-07-15 ENCOUNTER — Other Ambulatory Visit: Payer: Self-pay | Admitting: Family

## 2022-11-19 ENCOUNTER — Other Ambulatory Visit: Payer: Self-pay | Admitting: Family

## 2022-11-19 DIAGNOSIS — I10 Essential (primary) hypertension: Secondary | ICD-10-CM

## 2022-12-17 ENCOUNTER — Other Ambulatory Visit: Payer: Self-pay | Admitting: Family

## 2022-12-17 DIAGNOSIS — E785 Hyperlipidemia, unspecified: Secondary | ICD-10-CM

## 2022-12-20 ENCOUNTER — Other Ambulatory Visit: Payer: Self-pay | Admitting: Family

## 2022-12-20 DIAGNOSIS — I10 Essential (primary) hypertension: Secondary | ICD-10-CM

## 2022-12-22 ENCOUNTER — Other Ambulatory Visit: Payer: Self-pay | Admitting: Family

## 2022-12-22 DIAGNOSIS — I10 Essential (primary) hypertension: Secondary | ICD-10-CM

## 2022-12-23 MED ORDER — LISINOPRIL-HYDROCHLOROTHIAZIDE 20-12.5 MG PO TABS: 1.0000 | ORAL_TABLET | Freq: Every day | ORAL | 0 refills | Status: DC

## 2022-12-23 NOTE — Telephone Encounter (Signed)
Pt made appt for jan 30

## 2022-12-23 NOTE — Telephone Encounter (Signed)
Refills denied as pt has already been given 30 day supply. Please schedule pt for f/u with PCP to get refills.

## 2023-01-20 ENCOUNTER — Encounter: Payer: 59 | Admitting: Family

## 2023-01-22 ENCOUNTER — Other Ambulatory Visit: Payer: Self-pay | Admitting: Family

## 2023-01-22 DIAGNOSIS — E785 Hyperlipidemia, unspecified: Secondary | ICD-10-CM

## 2023-02-10 ENCOUNTER — Ambulatory Visit (INDEPENDENT_AMBULATORY_CARE_PROVIDER_SITE_OTHER): Payer: BC Managed Care – PPO | Admitting: Family

## 2023-02-10 ENCOUNTER — Encounter: Payer: Self-pay | Admitting: Family

## 2023-02-10 VITALS — BP 109/70 | HR 73 | Temp 97.6°F | Ht 70.0 in | Wt 251.0 lb

## 2023-02-10 DIAGNOSIS — Z0001 Encounter for general adult medical examination with abnormal findings: Secondary | ICD-10-CM | POA: Diagnosis not present

## 2023-02-10 DIAGNOSIS — Z125 Encounter for screening for malignant neoplasm of prostate: Secondary | ICD-10-CM | POA: Diagnosis not present

## 2023-02-10 DIAGNOSIS — I10 Essential (primary) hypertension: Secondary | ICD-10-CM

## 2023-02-10 DIAGNOSIS — K2100 Gastro-esophageal reflux disease with esophagitis, without bleeding: Secondary | ICD-10-CM

## 2023-02-10 DIAGNOSIS — E785 Hyperlipidemia, unspecified: Secondary | ICD-10-CM

## 2023-02-10 DIAGNOSIS — K760 Fatty (change of) liver, not elsewhere classified: Secondary | ICD-10-CM

## 2023-02-10 DIAGNOSIS — Z0000 Encounter for general adult medical examination without abnormal findings: Secondary | ICD-10-CM

## 2023-02-10 DIAGNOSIS — Z1211 Encounter for screening for malignant neoplasm of colon: Secondary | ICD-10-CM

## 2023-02-10 DIAGNOSIS — Z136 Encounter for screening for cardiovascular disorders: Secondary | ICD-10-CM | POA: Diagnosis not present

## 2023-02-10 MED ORDER — LISINOPRIL-HYDROCHLOROTHIAZIDE 20-12.5 MG PO TABS
1.0000 | ORAL_TABLET | Freq: Every day | ORAL | 3 refills | Status: DC
Start: 2023-02-10 — End: 2024-03-31

## 2023-02-10 MED ORDER — ATORVASTATIN CALCIUM 40 MG PO TABS: 40.0000 mg | ORAL_TABLET | Freq: Every day | ORAL | 3 refills | Status: DC

## 2023-02-10 MED ORDER — AMLODIPINE BESYLATE 10 MG PO TABS: 10.0000 mg | ORAL_TABLET | Freq: Every day | ORAL | 3 refills | Status: DC

## 2023-02-10 NOTE — Patient Instructions (Signed)
Health Maintenance, Male Adopting a healthy lifestyle and getting preventive care are important in promoting health and wellness. Ask your health care provider about: The right schedule for you to have regular tests and exams. Things you can do on your own to prevent diseases and keep yourself healthy. What should I know about diet, weight, and exercise? Eat a healthy diet  Eat a diet that includes plenty of vegetables, fruits, low-fat dairy products, and lean protein. Do not eat a lot of foods that are high in solid fats, added sugars, or sodium. Maintain a healthy weight Body mass index (BMI) is a measurement that can be used to identify possible weight problems. It estimates body fat based on height and weight. Your health care provider can help determine your BMI and help you achieve or maintain a healthy weight. Get regular exercise Get regular exercise. This is one of the most important things you can do for your health. Most adults should: Exercise for at least 150 minutes each week. The exercise should increase your heart rate and make you sweat (moderate-intensity exercise). Do strengthening exercises at least twice a week. This is in addition to the moderate-intensity exercise. Spend less time sitting. Even light physical activity can be beneficial. Watch cholesterol and blood lipids Have your blood tested for lipids and cholesterol at 50 years of age, then have this test every 5 years. You may need to have your cholesterol levels checked more often if: Your lipid or cholesterol levels are high. You are older than 50 years of age. You are at high risk for heart disease. What should I know about cancer screening? Many types of cancers can be detected early and may often be prevented. Depending on your health history and family history, you may need to have cancer screening at various ages. This may include screening for: Colorectal cancer. Prostate cancer. Skin cancer. Lung  cancer. What should I know about heart disease, diabetes, and high blood pressure? Blood pressure and heart disease High blood pressure causes heart disease and increases the risk of stroke. This is more likely to develop in people who have high blood pressure readings or are overweight. Talk with your health care provider about your target blood pressure readings. Have your blood pressure checked: Every 3-5 years if you are 18-39 years of age. Every year if you are 40 years old or older. If you are between the ages of 65 and 75 and are a current or former smoker, ask your health care provider if you should have a one-time screening for abdominal aortic aneurysm (AAA). Diabetes Have regular diabetes screenings. This checks your fasting blood sugar level. Have the screening done: Once every three years after age 45 if you are at a normal weight and have a low risk for diabetes. More often and at a younger age if you are overweight or have a high risk for diabetes. What should I know about preventing infection? Hepatitis B If you have a higher risk for hepatitis B, you should be screened for this virus. Talk with your health care provider to find out if you are at risk for hepatitis B infection. Hepatitis C Blood testing is recommended for: Everyone born from 1945 through 1965. Anyone with known risk factors for hepatitis C. Sexually transmitted infections (STIs) You should be screened each year for STIs, including gonorrhea and chlamydia, if: You are sexually active and are younger than 50 years of age. You are older than 50 years of age and your   health care provider tells you that you are at risk for this type of infection. Your sexual activity has changed since you were last screened, and you are at increased risk for chlamydia or gonorrhea. Ask your health care provider if you are at risk. Ask your health care provider about whether you are at high risk for HIV. Your health care provider  may recommend a prescription medicine to help prevent HIV infection. If you choose to take medicine to prevent HIV, you should first get tested for HIV. You should then be tested every 3 months for as long as you are taking the medicine. Follow these instructions at home: Alcohol use Do not drink alcohol if your health care provider tells you not to drink. If you drink alcohol: Limit how much you have to 0-2 drinks a day. Know how much alcohol is in your drink. In the [place] one drink equals one 12 oz bottle of beer (355 mL), one 5 oz glass of wine (148 mL), or one 1 oz glass of hard liquor (44 mL). Lifestyle Do not use any products that contain nicotine or tobacco. These products include cigarettes, chewing tobacco, and vaping devices, such as e-cigarettes. If you need help quitting, ask your health care provider. Do not use street drugs. Do not share needles. Ask your health care provider for help if you need support or information about quitting drugs. General instructions Schedule regular health, dental, and eye exams. Stay current with your vaccines. Tell your health care provider if: You often feel depressed. You have ever been abused or do not feel safe at home. Summary Adopting a healthy lifestyle and getting preventive care are important in promoting health and wellness. Follow your health care provider's instructions about healthy diet, exercising, and getting tested or screened for diseases. Follow your health care provider's instructions on monitoring your cholesterol and blood pressure. This information is not intended to replace advice given to you by your health care provider. Make sure you discuss any questions you have with your health care provider. Document Revised: 04/29/2021 Document Reviewed: 04/29/2021 Elsevier Patient Education  2023 Elsevier Inc.  

## 2023-02-10 NOTE — Progress Notes (Signed)
Subjective:    Patient ID: Kevin Russo, male    DOB: Nov 04, 1971, 52 y.o.   MRN: [id number]  Chief Complaint  Patient presents with   Annual Exam   Pt presents to the office today for CPE. He has hepatic steatosis and has had [place] in the past. He occasionally drinks every few months. However, does admit to eating whatever he wants.    He has morbid obese with a BMI of 36 and HTN and hyperlipidemia. Hypertension This is a chronic problem. The current episode started more than 1 year ago. The problem has been resolved since onset. Pertinent negatives include no malaise/fatigue, peripheral edema or shortness of breath. Risk factors for coronary artery disease include obesity, dyslipidemia and male gender. The current treatment provides moderate improvement.  Gastroesophageal Reflux He complains of belching and heartburn. This is a chronic problem. The current episode started more than 1 year ago. The problem occurs rarely. He has tried an antacid for the symptoms. The treatment provided significant relief.  Hyperlipidemia This is a chronic problem. The current episode started more than 1 year ago. The problem is controlled. Recent lipid tests were reviewed and are normal. Exacerbating diseases include obesity. Pertinent negatives include no shortness of breath. Current antihyperlipidemic treatment includes statins. The current treatment provides moderate improvement of lipids. Risk factors for coronary artery disease include dyslipidemia, hypertension, male sex and a sedentary lifestyle.      Review of Systems  Constitutional:  Negative for malaise/fatigue.  Respiratory:  Negative for shortness of breath.   Gastrointestinal:  Positive for heartburn.  All other systems reviewed and are negative.  Family History  Problem Relation Age of Onset   Healthy Mother    Healthy Father    Colon polyps Neg Hx    Crohn's disease Neg Hx    Social History   Socioeconomic History   Marital status:  Married    Spouse name: Not on file   Number of children: Not on file   Years of education: Not on file   Highest education level: Not on file  Occupational History   Not on file  Tobacco Use   Smoking status: Never   Smokeless tobacco: Never  Vaping Use   Vaping Use: Never used  Substance and Sexual Activity   Alcohol use: Yes    Comment: Occasionally   Drug use: No   Sexual activity: Not on file  Other Topics Concern   Not on file  Social History Narrative   Not on file   Social Determinants of Health   Financial Resource Strain: Not on file  Food Insecurity: Not on file  Transportation Needs: Not on file  Physical Activity: Not on file  Stress: Not on file  Social Connections: Not on file       Objective:   Physical Exam Vitals reviewed.  Constitutional:      General: He is not in acute distress.    Appearance: He is well-developed.  HENT:     Head: Normocephalic.     Right Ear: Tympanic membrane normal.     Left Ear: Tympanic membrane normal.  Eyes:     General:        Right eye: No discharge.        Left eye: No discharge.     Pupils: Pupils are equal, round, and reactive to light.  Neck:     Thyroid: No thyromegaly.  Cardiovascular:     Rate and Rhythm: Normal rate and regular  rhythm.     Heart sounds: Normal heart sounds. No murmur heard. Pulmonary:     Effort: Pulmonary effort is normal. No respiratory distress.     Breath sounds: Normal breath sounds. No wheezing.  Abdominal:     General: Bowel sounds are normal. There is no distension.     Palpations: Abdomen is soft.     Tenderness: There is no abdominal tenderness.  Musculoskeletal:        General: No tenderness. Normal range of motion.     Cervical back: Normal range of motion and neck supple.  Skin:    General: Skin is warm and dry.     Findings: No erythema or rash.  Neurological:     Mental Status: He is alert and oriented to person, place, and time.     Cranial Nerves: No cranial  nerve deficit.     Deep Tendon Reflexes: Reflexes are normal and symmetric.  Psychiatric:        Behavior: Behavior normal.        Thought Content: Thought content normal.        Judgment: Judgment normal.          BP 109/70   Pulse 73   Temp 97.6 F (36.4 C) (Temporal)   Ht 5' 10"$  (1.778 m)   Wt 251 lb (113.9 kg)   SpO2 98%   BMI 36.01 kg/m   Assessment & Plan:  [name] comes in today with chief complaint of Annual Exam   Diagnosis and orders addressed:  1. Primary hypertension - amLODipine (NORVASC) 10 MG tablet; Take 1 tablet (10 mg total) by mouth daily.  Dispense: 90 tablet; Refill: 3 - lisinopril-hydrochlorothiazide (ZESTORETIC) 20-12.5 MG tablet; Take 1 tablet by mouth daily.  Dispense: 90 tablet; Refill: 3 - CMP14+EGFR - CBC with Differential/Platelet  2. Hyperlipidemia, unspecified hyperlipidemia type - atorvastatin (LIPITOR) 40 MG tablet; Take 1 tablet (40 mg total) by mouth daily.  Dispense: 90 tablet; Refill: 3 - CMP14+EGFR - CBC with Differential/Platelet - Lipid panel  3. Annual physical exam - Ambulatory referral to Gastroenterology - CMP14+EGFR - CBC with Differential/Platelet - Lipid panel - PSA, total and free - TSH  4. Gastroesophageal reflux disease with esophagitis, unspecified whether hemorrhage - CMP14+EGFR - CBC with Differential/Platelet  5. Morbid obesity ([place])  - CMP14+EGFR - CBC with Differential/Platelet  6. Hepatic steatosis - CMP14+EGFR - CBC with Differential/Platelet  7. Colon cancer screening - Ambulatory referral to Gastroenterology - CMP14+EGFR - CBC with Differential/Platelet   Labs pending Health Maintenance reviewed Diet and exercise encouraged  Follow up plan: 1 year    Evelina Dun, FNP

## 2023-02-11 LAB — CBC WITH DIFFERENTIAL/PLATELET
Basophils Absolute: 0.1 10*3/uL (ref 0.0–0.2)
Basos: 1 %
EOS (ABSOLUTE): 0.2 10*3/uL (ref 0.0–0.4)
Eos: 2 %
Hematocrit: 48 % (ref 37.5–51.0)
Hemoglobin: 16.2 g/dL (ref 13.0–17.7)
Immature Grans (Abs): 0 10*3/uL (ref 0.0–0.1)
Immature Granulocytes: 0 %
Lymphocytes Absolute: 2.1 10*3/uL (ref 0.7–3.1)
Lymphs: 28 %
MCH: 29.3 pg (ref 26.6–33.0)
MCHC: 33.8 g/dL (ref 31.5–35.7)
MCV: 87 fL (ref 79–97)
Monocytes Absolute: 0.8 10*3/uL (ref 0.1–0.9)
Monocytes: 11 %
Neutrophils Absolute: 4.4 10*3/uL (ref 1.4–7.0)
Neutrophils: 58 %
Platelets: 360 10*3/uL (ref 150–450)
RBC: 5.53 x10E6/uL (ref 4.14–5.80)
RDW: 12.6 % (ref 11.6–15.4)
WBC: 7.6 10*3/uL (ref 3.4–10.8)

## 2023-02-11 LAB — CMP14+EGFR
ALT: 97 IU/L — ABNORMAL HIGH (ref 0–44)
AST: 50 IU/L — ABNORMAL HIGH (ref 0–40)
Albumin/Globulin Ratio: 1.6 (ref 1.2–2.2)
Albumin: 4.8 g/dL (ref 4.1–5.1)
Alkaline Phosphatase: 148 IU/L — ABNORMAL HIGH (ref 44–121)
BUN/Creatinine Ratio: 15 (ref 9–20)
BUN: 15 mg/dL (ref 6–24)
Bilirubin Total: 0.5 mg/dL (ref 0.0–1.2)
CO2: 24 mmol/L (ref 20–29)
Calcium: 9.9 mg/dL (ref 8.7–10.2)
Chloride: 97 mmol/L (ref 96–106)
Creatinine, Ser: 1 mg/dL (ref 0.76–1.27)
Globulin, Total: 3 g/dL (ref 1.5–4.5)
Glucose: 78 mg/dL (ref 70–99)
Potassium: 4.3 mmol/L (ref 3.5–5.2)
Sodium: 137 mmol/L (ref 134–144)
Total Protein: 7.8 g/dL (ref 6.0–8.5)
eGFR: 92 mL/min/{1.73_m2} (ref >59)

## 2023-02-11 LAB — PSA, TOTAL AND FREE
PSA, Free Pct: 62 %
PSA, Free: 0.31 ng/mL
Prostate Specific Ag, Serum: 0.5 ng/mL (ref 0.0–4.0)

## 2023-02-11 LAB — LIPID PANEL
Chol/HDL Ratio: 3.6 ratio (ref 0.0–5.0)
Cholesterol, Total: 145 mg/dL (ref 100–199)
HDL: 40 mg/dL (ref >39)
LDL Chol Calc (NIH): 80 mg/dL (ref 0–99)
Triglycerides: 143 mg/dL (ref 0–149)
VLDL Cholesterol Cal: 25 mg/dL (ref 5–40)

## 2023-02-11 LAB — TSH: TSH: 0.732 u[IU]/mL (ref 0.450–4.500)

## 2023-03-01 ENCOUNTER — Other Ambulatory Visit: Payer: Self-pay | Admitting: Family

## 2023-03-01 DIAGNOSIS — E785 Hyperlipidemia, unspecified: Secondary | ICD-10-CM

## 2023-03-22 ENCOUNTER — Other Ambulatory Visit: Payer: Self-pay | Admitting: Family

## 2023-03-22 DIAGNOSIS — I10 Essential (primary) hypertension: Secondary | ICD-10-CM

## 2024-02-08 ENCOUNTER — Other Ambulatory Visit: Payer: Self-pay | Admitting: Family

## 2024-02-08 DIAGNOSIS — I10 Essential (primary) hypertension: Secondary | ICD-10-CM

## 2024-02-08 DIAGNOSIS — E785 Hyperlipidemia, unspecified: Secondary | ICD-10-CM

## 2024-03-31 ENCOUNTER — Other Ambulatory Visit: Payer: Self-pay | Admitting: Family

## 2024-03-31 ENCOUNTER — Telehealth: Payer: Self-pay | Admitting: Family

## 2024-03-31 DIAGNOSIS — E785 Hyperlipidemia, unspecified: Secondary | ICD-10-CM

## 2024-03-31 DIAGNOSIS — Z0000 Encounter for general adult medical examination without abnormal findings: Secondary | ICD-10-CM

## 2024-03-31 DIAGNOSIS — I10 Essential (primary) hypertension: Secondary | ICD-10-CM

## 2024-03-31 MED ORDER — AMLODIPINE BESYLATE 10 MG PO TABS: 10.0000 mg | ORAL_TABLET | Freq: Every day | ORAL | 0 refills | Status: DC

## 2024-03-31 MED ORDER — LISINOPRIL-HYDROCHLOROTHIAZIDE 20-12.5 MG PO TABS: 1.0000 | ORAL_TABLET | Freq: Every day | ORAL | 0 refills | Status: DC

## 2024-03-31 MED ORDER — ATORVASTATIN CALCIUM 40 MG PO TABS: 40.0000 mg | ORAL_TABLET | Freq: Every day | ORAL | 0 refills | Status: DC

## 2024-03-31 NOTE — Telephone Encounter (Signed)
 Future orders placed

## 2024-03-31 NOTE — Telephone Encounter (Signed)
 Pt scheduled appt for 04/12/2024, please send refill to pharmacy

## 2024-03-31 NOTE — Addendum Note (Signed)
 Addended by: Julious Payer D on: 03/31/2024 09:52 AM   Modules accepted: Orders

## 2024-03-31 NOTE — Telephone Encounter (Signed)
 [name] NTBS last OV 02/10/23 NO RF sent to pharmacy last OV greater than a year

## 2024-04-06 ENCOUNTER — Other Ambulatory Visit

## 2024-04-06 DIAGNOSIS — I10 Essential (primary) hypertension: Secondary | ICD-10-CM

## 2024-04-06 DIAGNOSIS — E785 Hyperlipidemia, unspecified: Secondary | ICD-10-CM | POA: Diagnosis not present

## 2024-04-06 DIAGNOSIS — Z0000 Encounter for general adult medical examination without abnormal findings: Secondary | ICD-10-CM

## 2024-04-07 ENCOUNTER — Ambulatory Visit: Admitting: Family Medicine

## 2024-04-07 ENCOUNTER — Ambulatory Visit: Admitting: Family

## 2024-04-07 LAB — CBC WITH DIFFERENTIAL/PLATELET
Basophils Absolute: 0.1 10*3/uL (ref 0.0–0.2)
Basos: 1 %
EOS (ABSOLUTE): 0.1 10*3/uL (ref 0.0–0.4)
Eos: 2 %
Hematocrit: 47.9 % (ref 37.5–51.0)
Hemoglobin: 15.7 g/dL (ref 13.0–17.7)
Immature Grans (Abs): 0 10*3/uL (ref 0.0–0.1)
Immature Granulocytes: 0 %
Lymphocytes Absolute: 1.7 10*3/uL (ref 0.7–3.1)
Lymphs: 29 %
MCH: 29.4 pg (ref 26.6–33.0)
MCHC: 32.8 g/dL (ref 31.5–35.7)
MCV: 90 fL (ref 79–97)
Monocytes Absolute: 0.7 10*3/uL (ref 0.1–0.9)
Monocytes: 11 %
Neutrophils Absolute: 3.3 10*3/uL (ref 1.4–7.0)
Neutrophils: 57 %
Platelets: 311 10*3/uL (ref 150–450)
RBC: 5.34 x10E6/uL (ref 4.14–5.80)
RDW: 12.5 % (ref 11.6–15.4)
WBC: 5.9 10*3/uL (ref 3.4–10.8)

## 2024-04-07 LAB — COMPREHENSIVE METABOLIC PANEL WITH GFR
ALT: 79 IU/L — ABNORMAL HIGH (ref 0–44)
AST: 35 IU/L (ref 0–40)
Albumin: 4.3 g/dL (ref 3.8–4.9)
Alkaline Phosphatase: 140 IU/L — ABNORMAL HIGH (ref 44–121)
BUN/Creatinine Ratio: 18 (ref 9–20)
BUN: 18 mg/dL (ref 6–24)
Bilirubin Total: 0.6 mg/dL (ref 0.0–1.2)
CO2: 22 mmol/L (ref 20–29)
Calcium: 9.3 mg/dL (ref 8.7–10.2)
Chloride: 102 mmol/L (ref 96–106)
Creatinine, Ser: 0.99 mg/dL (ref 0.76–1.27)
Globulin, Total: 2.9 g/dL (ref 1.5–4.5)
Glucose: 92 mg/dL (ref 70–99)
Potassium: 4 mmol/L (ref 3.5–5.2)
Sodium: 139 mmol/L (ref 134–144)
Total Protein: 7.2 g/dL (ref 6.0–8.5)
eGFR: 92 mL/min/{1.73_m2} (ref >59)

## 2024-04-07 LAB — LIPID PANEL
Chol/HDL Ratio: 4.2 ratio (ref 0.0–5.0)
Cholesterol, Total: 150 mg/dL (ref 100–199)
HDL: 36 mg/dL — ABNORMAL LOW (ref >39)
LDL Chol Calc (NIH): 86 mg/dL (ref 0–99)
Triglycerides: 162 mg/dL — ABNORMAL HIGH (ref 0–149)
VLDL Cholesterol Cal: 28 mg/dL (ref 5–40)

## 2024-04-07 LAB — PSA, TOTAL AND FREE
PSA, Free Pct: 58.3 %
PSA, Free: 0.35 ng/mL
Prostate Specific Ag, Serum: 0.6 ng/mL (ref 0.0–4.0)

## 2024-04-07 LAB — TSH: TSH: 1.18 u[IU]/mL (ref 0.450–4.500)

## 2024-04-11 ENCOUNTER — Encounter: Payer: Self-pay | Admitting: Family

## 2024-04-12 ENCOUNTER — Ambulatory Visit: Admitting: Family

## 2024-05-12 ENCOUNTER — Encounter: Payer: Self-pay | Admitting: Family

## 2024-05-12 ENCOUNTER — Ambulatory Visit (INDEPENDENT_AMBULATORY_CARE_PROVIDER_SITE_OTHER): Admitting: Family

## 2024-05-12 VITALS — BP 102/67 | HR 74 | Temp 97.5°F | Ht 70.0 in | Wt 249.0 lb

## 2024-05-12 DIAGNOSIS — Z0000 Encounter for general adult medical examination without abnormal findings: Secondary | ICD-10-CM

## 2024-05-12 DIAGNOSIS — K760 Fatty (change of) liver, not elsewhere classified: Secondary | ICD-10-CM

## 2024-05-12 DIAGNOSIS — Z23 Encounter for immunization: Secondary | ICD-10-CM | POA: Diagnosis not present

## 2024-05-12 DIAGNOSIS — I10 Essential (primary) hypertension: Secondary | ICD-10-CM

## 2024-05-12 DIAGNOSIS — Z0001 Encounter for general adult medical examination with abnormal findings: Secondary | ICD-10-CM

## 2024-05-12 DIAGNOSIS — E785 Hyperlipidemia, unspecified: Secondary | ICD-10-CM | POA: Diagnosis not present

## 2024-05-12 DIAGNOSIS — Z1211 Encounter for screening for malignant neoplasm of colon: Secondary | ICD-10-CM | POA: Diagnosis not present

## 2024-05-12 MED ORDER — LISINOPRIL-HYDROCHLOROTHIAZIDE 20-12.5 MG PO TABS: 1.0000 | ORAL_TABLET | Freq: Every day | ORAL | 4 refills | Status: AC

## 2024-05-12 MED ORDER — ATORVASTATIN CALCIUM 40 MG PO TABS: 40.0000 mg | ORAL_TABLET | Freq: Every day | ORAL | 4 refills | Status: AC

## 2024-05-12 MED ORDER — AMLODIPINE BESYLATE 10 MG PO TABS
10.0000 mg | ORAL_TABLET | Freq: Every day | ORAL | 4 refills | Status: AC
Start: 2024-05-12 — End: ?

## 2024-05-12 NOTE — Progress Notes (Signed)
 Subjective:    Patient ID: Kevin Russo, male    DOB: 02/18/1971, 53 y.o.   MRN: [id number]  Chief Complaint  Patient presents with   Medical Management of Chronic Issues    No concerns. Patient is not fasting. Had blood work 3 weeks ago    Pt presents to the office today for CPE. He has hepatic steatosis and has had [place]  in the past. He has quit drinking. However, does admit to eating whatever he wants.    He has morbid obese with a BMI of 35 and HTN and hyperlipidemia. Hypertension This is a chronic problem. The current episode started more than 1 year ago. The problem has been resolved since onset. Pertinent negatives include no malaise/fatigue, peripheral edema or shortness of breath. Risk factors for coronary artery disease include obesity, dyslipidemia and male gender. The current treatment provides moderate improvement.  Gastroesophageal Reflux He complains of belching and heartburn. This is a chronic problem. The current episode started more than 1 year ago. The problem occurs rarely. The symptoms are aggravated by certain foods. He has tried an antacid for the symptoms. The treatment provided significant relief.  Hyperlipidemia This is a chronic problem. The current episode started more than 1 year ago. The problem is controlled. Recent lipid tests were reviewed and are normal. Exacerbating diseases include obesity. Pertinent negatives include no shortness of breath. Current antihyperlipidemic treatment includes statins. The current treatment provides moderate improvement of lipids. Risk factors for coronary artery disease include dyslipidemia, hypertension, male sex and a sedentary lifestyle.      Review of Systems  Constitutional:  Negative for malaise/fatigue.  Respiratory:  Negative for shortness of breath.   Gastrointestinal:  Positive for heartburn.  All other systems reviewed and are negative.  Family History  Problem Relation Age of Onset   Healthy Mother    Healthy  Father    Colon polyps Neg Hx    Crohn's disease Neg Hx    Social History   Socioeconomic History   Marital status: Married    Spouse name: Not on file   Number of children: Not on file   Years of education: Not on file   Highest education level: Bachelor's degree (e.g., BA, AB, BS)  Occupational History   Not on file  Tobacco Use   Smoking status: Never   Smokeless tobacco: Never  Vaping Use   Vaping status: Never Used  Substance and Sexual Activity   Alcohol use: Yes    Comment: Occasionally   Drug use: No   Sexual activity: Not on file  Other Topics Concern   Not on file  Social History Narrative   Not on file   Social Drivers of Health   Financial Resource Strain: Low Risk  (05/11/2024)   Overall Financial Resource Strain (CARDIA)    Difficulty of Paying Living Expenses: Not hard at all  Food Insecurity: No Food Insecurity (05/11/2024)   Hunger Vital Sign    Worried About Running Out of Food in the Last Year: Never true    Ran Out of Food in the Last Year: Never true  Transportation Needs: No Transportation Needs (05/11/2024)   PRAPARE - Administrator, Civil Service (Medical): No    Lack of Transportation (Non-Medical): No  Physical Activity: Insufficiently Active (05/11/2024)   Exercise Vital Sign    Days of Exercise per Week: 1 day    Minutes of Exercise per Session: 30 min  Stress: No Stress Concern Present (05/11/2024)  [employer] of Occupational Health - Occupational Stress Questionnaire    Feeling of Stress : Not at all  Social Connections: Moderately Isolated (05/11/2024)   Social Connection and Isolation Panel [NHANES]    Frequency of Communication with Friends and Family: Once a week    Frequency of Social Gatherings with Friends and Family: Never    Attends Religious Services: 1 to 4 times per year    Active Member of [employer] or Organizations: No    Attends Engineer, structural: Not on file    Marital Status: Married        Objective:   Physical Exam Vitals reviewed.  Constitutional:      General: He is not in acute distress.    Appearance: He is well-developed.  HENT:     Head: Normocephalic.     Right Ear: Tympanic membrane normal.     Left Ear: Tympanic membrane normal.  Eyes:     General:        Right eye: No discharge.        Left eye: No discharge.     Pupils: Pupils are equal, round, and reactive to light.  Neck:     Thyroid : No thyromegaly.  Cardiovascular:     Rate and Rhythm: Normal rate and regular rhythm.     Heart sounds: Normal heart sounds. No murmur heard. Pulmonary:     Effort: Pulmonary effort is normal. No respiratory distress.     Breath sounds: Normal breath sounds. No wheezing.  Abdominal:     General: Bowel sounds are normal. There is no distension.     Palpations: Abdomen is soft.     Tenderness: There is no abdominal tenderness.  Musculoskeletal:        General: No tenderness. Normal range of motion.     Cervical back: Normal range of motion and neck supple.  Skin:    General: Skin is warm and dry.     Findings: No erythema or rash.  Neurological:     Mental Status: He is alert and oriented to person, place, and time.     Cranial Nerves: No cranial nerve deficit.     Deep Tendon Reflexes: Reflexes are normal and symmetric.  Psychiatric:        Behavior: Behavior normal.        Thought Content: Thought content normal.        Judgment: Judgment normal.          BP 102/67   Pulse 74   Temp (!) 97.5 F (36.4 C) (Temporal)   Ht 5\' 10"  (1.778 m)   Wt 249 lb (112.9 kg)   SpO2 95%   BMI 35.73 kg/m   Assessment & Plan:  [name] comes in today with chief complaint of Medical Management of Chronic Issues (No concerns. Patient is not fasting. Had blood work 3 weeks ago )   Diagnosis and orders addressed:  1. Primary hypertension - amLODipine  (NORVASC ) 10 MG tablet; Take 1 tablet (10 mg total) by mouth daily.  Dispense: 90 tablet; Refill: 4 -  lisinopril -hydrochlorothiazide  (ZESTORETIC ) 20-12.5 MG tablet; Take 1 tablet by mouth daily.  Dispense: 90 tablet; Refill: 4  2. Hyperlipidemia, unspecified hyperlipidemia type - atorvastatin  (LIPITOR) 40 MG tablet; Take 1 tablet (40 mg total) by mouth daily.  Dispense: 90 tablet; Refill: 4  3. Annual physical exam (Primary) - Ambulatory referral to Gastroenterology  4. Colon cancer screening - Ambulatory referral to Gastroenterology  5. Hepatic steatosis Low fat  diet and exercise  Limit alcohol and tylenol   Labs reviewed  Continue current medications  Health Maintenance reviewed Diet and exercise encouraged  Follow up plan: 1 year    Tommas Fragmin, FNP

## 2024-05-12 NOTE — Patient Instructions (Signed)
 Health Maintenance, Male  Adopting a healthy lifestyle and getting preventive care are important in promoting health and wellness. Ask your health care provider about:  The right schedule for you to have regular tests and exams.  Things you can do on your own to prevent diseases and keep yourself healthy.  What should I know about diet, weight, and exercise?  Eat a healthy diet    Eat a diet that includes plenty of vegetables, fruits, low-fat dairy products, and lean protein.  Do not eat a lot of foods that are high in solid fats, added sugars, or sodium.  Maintain a healthy weight  Body mass index (BMI) is a measurement that can be used to identify possible weight problems. It estimates body fat based on height and weight. Your health care provider can help determine your BMI and help you achieve or maintain a healthy weight.  Get regular exercise  Get regular exercise. This is one of the most important things you can do for your health. Most adults should:  Exercise for at least 150 minutes each week. The exercise should increase your heart rate and make you sweat (moderate-intensity exercise).  Do strengthening exercises at least twice a week. This is in addition to the moderate-intensity exercise.  Spend less time sitting. Even light physical activity can be beneficial.  Watch cholesterol and blood lipids  Have your blood tested for lipids and cholesterol at 51 years of age, then have this test every 5 years.  You may need to have your cholesterol levels checked more often if:  Your lipid or cholesterol levels are high.  You are older than 51 years of age.  You are at high risk for heart disease.  What should I know about cancer screening?  Many types of cancers can be detected early and may often be prevented. Depending on your health history and family history, you may need to have cancer screening at various ages. This may include screening for:  Colorectal cancer.  Prostate cancer.  Skin cancer.  Lung  cancer.  What should I know about heart disease, diabetes, and high blood pressure?  Blood pressure and heart disease  High blood pressure causes heart disease and increases the risk of stroke. This is more likely to develop in people who have high blood pressure readings or are overweight.  Talk with your health care provider about your target blood pressure readings.  Have your blood pressure checked:  Every 3-5 years if you are 9-95 years of age.  Every year if you are 85 years old or older.  If you are between the ages of 29 and 29 and are a current or former smoker, ask your health care provider if you should have a one-time screening for abdominal aortic aneurysm (AAA).  Diabetes  Have regular diabetes screenings. This checks your fasting blood sugar level. Have the screening done:  Once every three years after age 23 if you are at a normal weight and have a low risk for diabetes.  More often and at a younger age if you are overweight or have a high risk for diabetes.  What should I know about preventing infection?  Hepatitis B  If you have a higher risk for hepatitis B, you should be screened for this virus. Talk with your health care provider to find out if you are at risk for hepatitis B infection.  Hepatitis C  Blood testing is recommended for:  Everyone born from [birth year] through 1965.  Anyone  with known risk factors for hepatitis C.  Sexually transmitted infections (STIs)  You should be screened each year for STIs, including gonorrhea and chlamydia, if:  You are sexually active and are younger than 51 years of age.  You are older than 51 years of age and your health care provider tells you that you are at risk for this type of infection.  Your sexual activity has changed since you were last screened, and you are at increased risk for chlamydia or gonorrhea. Ask your health care provider if you are at risk.  Ask your health care provider about whether you are at high risk for HIV. Your health care provider  may recommend a prescription medicine to help prevent HIV infection. If you choose to take medicine to prevent HIV, you should first get tested for HIV. You should then be tested every 3 months for as long as you are taking the medicine.  Follow these instructions at home:  Alcohol use  Do not drink alcohol if your health care provider tells you not to drink.  If you drink alcohol:  Limit how much you have to 0-2 drinks a day.  Know how much alcohol is in your drink. In the [place] one drink equals one 12 oz bottle of beer (355 mL), one 5 oz glass of wine (148 mL), or one 1 oz glass of hard liquor (44 mL).  Lifestyle  Do not use any products that contain nicotine or tobacco. These products include cigarettes, chewing tobacco, and vaping devices, such as e-cigarettes. If you need help quitting, ask your health care provider.  Do not use street drugs.  Do not share needles.  Ask your health care provider for help if you need support or information about quitting drugs.  General instructions  Schedule regular health, dental, and eye exams.  Stay current with your vaccines.  Tell your health care provider if:  You often feel depressed.  You have ever been abused or do not feel safe at home.  Summary  Adopting a healthy lifestyle and getting preventive care are important in promoting health and wellness.  Follow your health care provider's instructions about healthy diet, exercising, and getting tested or screened for diseases.  Follow your health care provider's instructions on monitoring your cholesterol and blood pressure.  This information is not intended to replace advice given to you by your health care provider. Make sure you discuss any questions you have with your health care provider.  Document Revised: 04/29/2021 Document Reviewed: 04/29/2021  Elsevier Patient Education  2024 [employer].
